# Patient Record
Sex: Male | Born: 1980 | Race: White | Hispanic: No | Marital: Single | Smoking: Former smoker
Health system: Southern US, Community
[De-identification: ages and names within clinical notes are randomized; demographics above are authoritative.]

## PROBLEM LIST (undated history)

## (undated) DIAGNOSIS — F411 Generalized anxiety disorder: Principal | ICD-10-CM

## (undated) DIAGNOSIS — I10 Essential (primary) hypertension: Secondary | ICD-10-CM

## (undated) DIAGNOSIS — F419 Anxiety disorder, unspecified: Secondary | ICD-10-CM

## (undated) DIAGNOSIS — E785 Hyperlipidemia, unspecified: Secondary | ICD-10-CM

## (undated) DIAGNOSIS — Z87442 Personal history of urinary calculi: Secondary | ICD-10-CM

## (undated) HISTORY — DX: Anxiety disorder, unspecified: F41.9

## (undated) HISTORY — PX: KIDNEY STONE SURGERY: SHX686

## (undated) HISTORY — DX: Generalized anxiety disorder: F41.1

## (undated) HISTORY — PX: EYE SURGERY: SHX253

## (undated) HISTORY — DX: Personal history of urinary calculi: Z87.442

## (undated) HISTORY — DX: Hyperlipidemia, unspecified: E78.5

## (undated) HISTORY — DX: Essential (primary) hypertension: I10

---

## 2009-11-09 ENCOUNTER — Emergency Department (HOSPITAL_BASED_OUTPATIENT_CLINIC_OR_DEPARTMENT_OTHER): Admission: EM | Admit: 2009-11-09 | Discharge: 2009-11-09 | Payer: Self-pay | Admitting: Emergency Medicine

## 2009-11-09 ENCOUNTER — Ambulatory Visit: Payer: Self-pay | Admitting: Diagnostic Radiology

## 2011-02-24 LAB — URINALYSIS, ROUTINE W REFLEX MICROSCOPIC
Bilirubin Urine: NEGATIVE
Ketones, ur: NEGATIVE mg/dL
Leukocytes, UA: NEGATIVE
Nitrite: NEGATIVE
Specific Gravity, Urine: 1.022 (ref 1.005–1.030)
Urobilinogen, UA: 0.2 mg/dL (ref 0.0–1.0)
pH: 6 (ref 5.0–8.0)

## 2011-02-24 LAB — BASIC METABOLIC PANEL
BUN: 15 mg/dL (ref 6–23)
CO2: 30 mEq/L (ref 19–32)
Calcium: 9.5 mg/dL (ref 8.4–10.5)
Chloride: 105 mEq/L (ref 96–112)
Creatinine, Ser: 0.9 mg/dL (ref 0.4–1.5)

## 2011-02-24 LAB — CBC
HCT: 45.3 % (ref 39.0–52.0)
MCHC: 35.2 g/dL (ref 30.0–36.0)
MCV: 90.5 fL (ref 78.0–100.0)
Platelets: 145 10*3/uL — ABNORMAL LOW (ref 150–400)
RDW: 11.9 % (ref 11.5–15.5)
WBC: 10.8 10*3/uL — ABNORMAL HIGH (ref 4.0–10.5)

## 2011-02-24 LAB — DIFFERENTIAL
Basophils Absolute: 0.1 10*3/uL (ref 0.0–0.1)
Basophils Relative: 1 % (ref 0–1)
Eosinophils Absolute: 0.1 10*3/uL (ref 0.0–0.7)
Eosinophils Relative: 1 % (ref 0–5)
Lymphocytes Relative: 26 % (ref 12–46)
Lymphs Abs: 2.8 10*3/uL (ref 0.7–4.0)
Monocytes Absolute: 0.9 10*3/uL (ref 0.1–1.0)
Monocytes Relative: 8 % (ref 3–12)
Neutro Abs: 6.9 10*3/uL (ref 1.7–7.7)
Neutrophils Relative %: 65 % (ref 43–77)

## 2011-02-24 LAB — URINE MICROSCOPIC-ADD ON

## 2016-06-03 ENCOUNTER — Emergency Department
Admission: EM | Admit: 2016-06-03 | Discharge: 2016-06-03 | Disposition: A | Discharge status: Home / Self Care | Payer: BLUE CROSS/BLUE SHIELD | Source: Home / Self Care | Attending: Family Medicine | Admitting: Family Medicine

## 2016-06-03 ENCOUNTER — Emergency Department (INDEPENDENT_AMBULATORY_CARE_PROVIDER_SITE_OTHER): Payer: BLUE CROSS/BLUE SHIELD

## 2016-06-03 ENCOUNTER — Encounter: Payer: Self-pay | Admitting: *Deleted

## 2016-06-03 DIAGNOSIS — M79644 Pain in right finger(s): Secondary | ICD-10-CM

## 2016-06-03 DIAGNOSIS — M79672 Pain in left foot: Secondary | ICD-10-CM

## 2016-06-03 DIAGNOSIS — M25541 Pain in joints of right hand: Secondary | ICD-10-CM | POA: Diagnosis not present

## 2016-06-03 DIAGNOSIS — M79641 Pain in right hand: Secondary | ICD-10-CM | POA: Diagnosis not present

## 2016-06-03 DIAGNOSIS — M7989 Other specified soft tissue disorders: Secondary | ICD-10-CM | POA: Diagnosis not present

## 2016-06-03 NOTE — ED Notes (Signed)
Pt c/o RT index finger swelling x 8 days. Denies injury or pain. He also c/o LT foot swelling, bruising and pain x 1 day.

## 2016-06-03 NOTE — ED Provider Notes (Signed)
CSN: [id number]     Arrival date & time 06/03/16  1049 History   First MD Initiated Contact with Patient 06/03/16 1100     Chief Complaint  Patient presents with  . Joint Swelling  . Foot Pain   (Consider location/radiation/quality/duration/timing/severity/associated sxs/prior Treatment) HPI  Anthony Huang is a 35 y.o. male presenting to UC with c/o Right index finger joint pain and Left foot pain.  Finger pain started about 8 days ago, foot pain started about 1 day ago with associated mild swelling and bruising. He notes he was at the beach last week and did a lot of jumping into and off of a boat, and helped raise and lower the anchor of the boat on the beach. He does not recall any specific injury but concerned he may have hit something to cause both area of pain and swelling. 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 He did take ibuprofen yesterday with mild relief. Pain in finger and foot is aching and sore, 5/10. Finger pain worse with full flexion. Foot pain worse with ambulation.    History reviewed. No pertinent past medical history. Past Surgical History  Procedure Laterality Date  . Kidney stone surgery     Family History  Problem Relation Age of Onset  . Hypertension Mother   . Hypertension Father    Social History  Substance Use Topics  . Smoking status: Never Smoker   . Smokeless tobacco: None  . Alcohol Use: Yes    Review of Systems  Constitutional: Negative for fever and chills.  Musculoskeletal: Positive for myalgias, joint swelling and arthralgias.  Skin: Positive for color change. Negative for wound.  Neurological: Negative for weakness and numbness.    Allergies  Review of patient's allergies indicates no known allergies.  Home Medications   Prior to Admission medications   Not on File   Meds Ordered and Administered this Visit  Medications - No data to  display  BP 147/99 mmHg  Pulse 82  Temp(Src) 98.1 F (36.7 C) (Oral)  Resp 16  Ht  (1.803 m)  Wt 208 lb (94.348 kg)  BMI 29.02 kg/m2  SpO2 98% No data found.   Physical Exam  Constitutional: He is oriented to person, place, and time. He appears well-developed and well-nourished.  HENT:  Head: Normocephalic and atraumatic.  Eyes: EOM are normal.  Neck: Normal range of motion.  Cardiovascular: Normal rate.   Pulses:      Radial pulses are 2+ on the right side.       Dorsalis pedis pulses are 2+ on the left side.  Right index finger: cap refill < 3 seconds  Pulmonary/Chest: Effort normal.  Musculoskeletal: Normal range of motion. He exhibits edema and tenderness.  Right index finger: 2mm nodule on dorsal aspect of PIP, tender.  Full ROM.  Left foot: tenderness to dorsal/lateral aspect. Mild edema. Full ROM ankle and toes.  Neurological: He is alert and oriented to person, place, and time.  Skin: Skin is warm and dry.  Right index finger: skin in tact, no ecchymosis or erythema. Small callus/nodule on dorsal aspect of PIP. Tender. Left foot: 3cm area of mild edema and ecchymosis with tenderness on dorsal lateral aspect of foot.  Psychiatric: He has a normal mood and affect. His behavior is normal.  Nursing note and vitals reviewed.   ED Course  Procedures (including critical care time)  Labs Review Labs Reviewed - No data to display  Imaging Review Dg Hand Complete Right  06/03/2016  CLINICAL DATA:  Pain and swelling for approximately 1 week. No history of recent trauma EXAM: RIGHT HAND - COMPLETE 3+ VIEW COMPARISON:  None. FINDINGS: Frontal, oblique, and lateral views were obtained. There is evidence of old injury with remodeling of the distal fifth metacarpal. There is no acute fracture or dislocation. Joint spaces appear normal. No erosive change. IMPRESSION: Evidence of old trauma with remodeling involving the distal fifth metacarpal. No acute fracture or  dislocation. No apparent arthropathy. Electronically Signed   By: Bretta BangWilliam  Woodruff III M.D.   On: 06/03/2016 11:45   Dg Foot Complete Left  06/03/2016  CLINICAL DATA:  Pain and swelling for 1 week. EXAM: LEFT FOOT - COMPLETE 3+ VIEW COMPARISON:  None. FINDINGS: Frontal, oblique, and lateral views were obtained. There is no fracture or dislocation. The joint spaces appear normal. No erosive change. IMPRESSION: No fracture or dislocation.  No apparent arthropathy. Electronically Signed   By: Bretta BangWilliam  Woodruff III M.D.   On: 06/03/2016 11:44      MDM   1. Left foot pain   2. Finger pain, right    Pt c/o Right index finger pain and Left foot pain w/o specific known injuries.  PMS in tact.  Plain films: negative for acute abnormalities including no fractures or dislocations  Encouraged rest, ice, elevate, may take acetaminophen or ibuprofen. F/u with Dr. Denyse Amassorey, Sports medicine in 1-2 weeks if not improving. Patient verbalized understanding and agreement with treatment plan.     Junius Finnerrin O'Malley, PA-C 06/03/16 1158

## 2016-06-03 NOTE — Discharge Instructions (Signed)
° °  You may take 400-600mg  Ibuprofen (Motrin or Advil) every 6-8 hours for pain  Alternate with Tylenol  You may take 500mg  Tylenol every 4-6 hours as needed for pain

## 2016-10-13 ENCOUNTER — Ambulatory Visit: Payer: BLUE CROSS/BLUE SHIELD | Admitting: Physician Assistant

## 2016-11-04 ENCOUNTER — Ambulatory Visit (INDEPENDENT_AMBULATORY_CARE_PROVIDER_SITE_OTHER): Payer: BLUE CROSS/BLUE SHIELD | Admitting: Physician Assistant

## 2016-11-04 ENCOUNTER — Encounter: Payer: Self-pay | Admitting: Physician Assistant

## 2016-11-04 VITALS — BP 144/92 | HR 98 | Ht 71.0 in | Wt 202.0 lb

## 2016-11-04 DIAGNOSIS — Z13220 Encounter for screening for lipoid disorders: Secondary | ICD-10-CM | POA: Diagnosis not present

## 2016-11-04 DIAGNOSIS — Z0000 Encounter for general adult medical examination without abnormal findings: Secondary | ICD-10-CM

## 2016-11-04 DIAGNOSIS — Z87442 Personal history of urinary calculi: Secondary | ICD-10-CM

## 2016-11-04 DIAGNOSIS — I10 Essential (primary) hypertension: Secondary | ICD-10-CM | POA: Insufficient documentation

## 2016-11-04 DIAGNOSIS — F411 Generalized anxiety disorder: Secondary | ICD-10-CM

## 2016-11-04 DIAGNOSIS — Z131 Encounter for screening for diabetes mellitus: Secondary | ICD-10-CM | POA: Diagnosis not present

## 2016-11-04 HISTORY — DX: Personal history of urinary calculi: Z87.442

## 2016-11-04 HISTORY — DX: Generalized anxiety disorder: F41.1

## 2016-11-04 NOTE — Progress Notes (Signed)
   Subjective:    Patient ID: Anthony MeckelMatthew Huang, male    DOB: 06/08/1981, 35 y.o.   MRN: [id number]  HPI Pt is a 35 yo male who presents to the clinic to establish care.   .. Active Ambulatory Problems    Diagnosis Date Noted  . History of kidney stones 11/04/2016   Resolved Ambulatory Problems    Diagnosis Date Noted  . No Resolved Ambulatory Problems   No Additional Past Medical History   .Marland Kitchen. Family History  Problem Relation Age of Onset  . Hypertension Mother   . Hypertension Father   . Cancer Paternal Aunt     lung  . Suicidality Paternal Uncle   . Diabetes Maternal Grandfather    .Marland Kitchen. Social History   Social History  . Marital status: Single    Spouse name: N/A  . Number of children: N/A  . Years of education: N/A   Occupational History  . Not on file.   Social History Main Topics  . Smoking status: Former Games developermoker  . Smokeless tobacco: Never Used  . Alcohol use Yes  . Drug use: No  . Sexual activity: Yes   Other Topics Concern  . Not on file   Social History Narrative  . No narrative on file   Pt does have anxiety but never had medication. Exercise and eating right helps. He would like labs to make sure "everything looks ok". Family hx of HTN and personal hx. Does not want medication. Not checking BP at home. Denies any CP, palpitations, headaches or dizziness. Denies any suicidal or homicidal thoughts.    Review of Systems  All other systems reviewed and are negative.      Objective:   Physical Exam  Constitutional: He is oriented to person, place, and time. He appears well-developed and well-nourished.  HENT:  Head: Normocephalic and atraumatic.  Right Ear: External ear normal.  Left Ear: External ear normal.  Nose: Nose normal.  Mouth/Throat: Oropharynx is clear and moist. No oropharyngeal exudate.  Eyes: Conjunctivae are normal.  Neck: Normal range of motion. Neck supple.  Cardiovascular: Normal rate, regular rhythm and normal heart sounds.    Pulmonary/Chest: Effort normal and breath sounds normal.  Abdominal: Soft. Bowel sounds are normal. He exhibits no distension and no mass. There is no tenderness. There is no rebound and no guarding.  Lymphadenopathy:    He has no cervical adenopathy.  Neurological: He is alert and oriented to person, place, and time.  Psychiatric: He has a normal mood and affect. His behavior is normal.          Assessment & Plan:  Marland Kitchen.Marland Kitchen.Anthony Huang was seen today for establish care.  Diagnoses and all orders for this visit:  Annual physical exam -     Lipid panel -     COMPLETE METABOLIC PANEL WITH GFR  History of kidney stones  Screening for diabetes mellitus -     COMPLETE METABOLIC PANEL WITH GFR  Screening for lipid disorders -     Lipid panel  Essential hypertension, benign  GAD (generalized anxiety disorder)      Discussed DASH diet and exercise. Recheck in 2 months. If not decreased need to consider medication.  HO given for CPE.  Discussed anxiety and medication and ways to decrease anxiety. Follow up as needed.

## 2016-11-04 NOTE — Patient Instructions (Addendum)
Hypertension Hypertension is another name for high blood pressure. High blood pressure forces your heart to work harder to pump blood. A blood pressure reading has two numbers, which includes a higher number over a lower number (example: 110/72). Follow these instructions at home:  Have your blood pressure rechecked by your doctor.  Only take medicine as told by your doctor. Follow the directions carefully. The medicine does not work as well if you skip doses. Skipping doses also puts you at risk for problems.  Do not smoke.  Monitor your blood pressure at home as told by your doctor. Contact a doctor if:  You think you are having a reaction to the medicine you are taking.  You have repeat headaches or feel dizzy.  You have puffiness (swelling) in your ankles.  You have trouble with your vision. Get help right away if:  You get a very bad headache and are confused.  You feel weak, numb, or faint.  You get chest or belly (abdominal) pain.  You throw up (vomit).  You cannot breathe very well. This information is not intended to replace advice given to you by your health care provider. Make sure you discuss any questions you have with your health care provider. Document Released: 04/28/2008 Document Revised: 04/17/2016 Document Reviewed: 09/02/2013 Elsevier Interactive Patient Education  2017 [employer] DASH Eating Plan DASH stands for "Dietary Approaches to Stop Hypertension." The DASH eating plan is a healthy eating plan that has been shown to reduce high blood pressure (hypertension). Additional health benefits may include reducing the risk of type 2 diabetes mellitus, heart disease, and stroke. The DASH eating plan may also help with weight loss. What do I need to know about the DASH eating plan? For the DASH eating plan, you will follow these general guidelines:  Choose foods with less than 150 milligrams of sodium per serving (as listed on the food label).  Use  salt-free seasonings or herbs instead of table salt or sea salt.  Check with your health care provider or pharmacist before using salt substitutes.  Eat lower-sodium products. These are often labeled as "low-sodium" or "no salt added."  Eat fresh foods. Avoid eating a lot of canned foods.  Eat more vegetables, fruits, and low-fat dairy products.  Choose whole grains. Look for the word "whole" as the first word in the ingredient list.  Choose fish and skinless chicken or turkey more often than red meat. Limit fish, poultry, and meat to 6 oz (170 g) each day.  Limit sweets, desserts, sugars, and sugary drinks.  Choose heart-healthy fats.  Eat more home-cooked food and less restaurant, buffet, and fast food.  Limit fried foods.  Do not fry foods. Cook foods using methods such as baking, boiling, grilling, and broiling instead.  When eating at a restaurant, ask that your food be prepared with less salt, or no salt if possible. What foods can I eat? Seek help from a dietitian for individual calorie needs. Grains  Whole grain or whole wheat bread. Brown rice. Whole grain or whole wheat pasta. Quinoa, bulgur, and whole grain cereals. Low-sodium cereals. Corn or whole wheat flour tortillas. Whole grain cornbread. Whole grain crackers. Low-sodium crackers. Vegetables  Fresh or frozen vegetables (raw, steamed, roasted, or grilled). Low-sodium or reduced-sodium tomato and vegetable juices. Low-sodium or reduced-sodium tomato sauce and paste. Low-sodium or reduced-sodium canned vegetables. Fruits  All fresh, canned (in natural juice), or frozen fruits. Meat and Other Protein Products  Ground beef (85% or leaner), grass-fed   beef, or beef trimmed of fat. Skinless chicken or [place]. Ground chicken or [place]. Pork trimmed of fat. All fish and seafood. Eggs. Dried beans, peas, or lentils. Unsalted nuts and seeds. Unsalted canned beans. Dairy  Low-fat dairy products, such as skim or 1% milk, 2% or  reduced-fat cheeses, low-fat ricotta or cottage cheese, or plain low-fat yogurt. Low-sodium or reduced-sodium cheeses. Fats and Oils  Tub margarines without trans fats. Light or reduced-fat mayonnaise and salad dressings (reduced sodium). Avocado. Safflower, olive, or canola oils. Natural peanut or almond butter. Other  Unsalted popcorn and pretzels. The items listed above may not be a complete list of recommended foods or beverages. Contact your dietitian for more options.  What foods are not recommended? Grains  White bread. White pasta. White rice. Refined cornbread. Bagels and croissants. Crackers that contain trans fat. Vegetables  Creamed or fried vegetables. Vegetables in a cheese sauce. Regular canned vegetables. Regular canned tomato sauce and paste. Regular tomato and vegetable juices. Fruits  Canned fruit in light or heavy syrup. Fruit juice. Meat and Other Protein Products  Fatty cuts of meat. Ribs, chicken wings, bacon, sausage, bologna, salami, chitterlings, fatback, hot dogs, bratwurst, and packaged luncheon meats. Salted nuts and seeds. Canned beans with salt. Dairy  Whole or 2% milk, cream, half-and-half, and cream cheese. Whole-fat or sweetened yogurt. Full-fat cheeses or blue cheese. Nondairy creamers and whipped toppings. Processed cheese, cheese spreads, or cheese curds. Condiments  Onion and garlic salt, seasoned salt, table salt, and sea salt. Canned and packaged gravies. Worcestershire sauce. Tartar sauce. Barbecue sauce. Teriyaki sauce. Soy sauce, including reduced sodium. Steak sauce. Fish sauce. Oyster sauce. Cocktail sauce. Horseradish. Ketchup and mustard. Meat flavorings and tenderizers. Bouillon cubes. Hot sauce. Tabasco sauce. Marinades. Taco seasonings. Relishes. Fats and Oils  Butter, stick margarine, lard, shortening, ghee, and bacon fat. Coconut, palm kernel, or palm oils. Regular salad dressings. Other  Pickles and olives. Salted popcorn and pretzels. The  items listed above may not be a complete list of foods and beverages to avoid. Contact your dietitian for more information.  Where can I find more information? National Heart, Lung, and Blood Institute: CablePromo.itwww.nhlbi.nih.gov/health/health-topics/topics/dash/ This information is not intended to replace advice given to you by your health care provider. Make sure you discuss any questions you have with your health care provider. Document Released: 10/30/2011 Document Revised: 04/17/2016 Document Reviewed: 09/14/2013 Elsevier Interactive Patient Education  2017 Else[employer] Inc. Generalized Anxiety Disorder Generalized anxiety disorder (GAD) is a mental disorder. It interferes with life functions, including relationships, work, and school. GAD is different from normal anxiety, which everyone experiences at some point in their lives in response to specific life events and activities. Normal anxiety actually helps us prepare for and get through these life events and activities. Normal anxiety goes away after the event or activity is over.  GAD causes anxiety that is not necessarily related to specific events or activities. It also causes excess anxiety in proportion to specific events or activities. The anxiety associated with GAD is also difficult to control. GAD can vary from mild to severe. People with severe GAD can have intense waves of anxiety with physical symptoms (panic attacks).  SYMPTOMS The anxiety and worry associated with GAD are difficult to control. This anxiety and worry are related to many life events and activities and also occur more days than not for 6 months or longer. People with GAD also have three or more of the following symptoms (one or more in children):  Restlessness.  Fatigue.  Difficulty concentrating.   Irritability.  Muscle tension.  Difficulty sleeping or unsatisfying sleep. DIAGNOSIS GAD is diagnosed through an assessment by your health care provider. Your health  care provider will ask you questions aboutyour mood,physical symptoms, and events in your life. Your health care provider may ask you about your medical history and use of alcohol or drugs, including prescription medicines. Your health care provider may also do a physical exam and blood tests. Certain medical conditions and the use of certain substances can cause symptoms similar to those associated with GAD. Your health care provider may refer you to a mental health specialist for further evaluation. TREATMENT The following therapies are usually used to treat GAD:   Medication. Antidepressant medication usually is prescribed for long-term daily control. Antianxiety medicines may be added in severe cases, especially when panic attacks occur.   Talk therapy (psychotherapy). Certain types of talk therapy can be helpful in treating GAD by providing support, education, and guidance. A form of talk therapy called cognitive behavioral therapy can teach you healthy ways to think about and react to daily life events and activities.  Stress managementtechniques. These include yoga, meditation, and exercise and can be very helpful when they are practiced regularly. A mental health specialist can help determine which treatment is best for you. Some people see improvement with one therapy. However, other people require a combination of therapies. This information is not intended to replace advice given to you by your health care provider. Make sure you discuss any questions you have with your health care provider. Document Released: 03/07/2013 Document Revised: 12/01/2014 Document Reviewed: 03/07/2013 Elsevier Interactive Patient Education  2017 [employer]

## 2016-11-05 LAB — COMPLETE METABOLIC PANEL WITH GFR
ALBUMIN: 4.8 g/dL (ref 3.6–5.1)
ALK PHOS: 46 U/L (ref 40–115)
ALT: 20 U/L (ref 9–46)
AST: 19 U/L (ref 10–40)
BILIRUBIN TOTAL: 0.5 mg/dL (ref 0.2–1.2)
BUN: 13 mg/dL (ref 7–25)
CALCIUM: 9.6 mg/dL (ref 8.6–10.3)
CO2: 29 mmol/L (ref 20–31)
CREATININE: 0.89 mg/dL (ref 0.60–1.35)
Chloride: 102 mmol/L (ref 98–110)
GFR, Est Non African American: >89 mL/min (ref >=60)
Glucose, Bld: 94 mg/dL (ref 65–99)
Potassium: 4.2 mmol/L (ref 3.5–5.3)
Sodium: 139 mmol/L (ref 135–146)
Total Protein: 7.3 g/dL (ref 6.1–8.1)

## 2016-11-05 LAB — LIPID PANEL
CHOLESTEROL: 191 mg/dL (ref <200)
HDL: 34 mg/dL — ABNORMAL LOW (ref >40)
LDL Cholesterol: 133 mg/dL — ABNORMAL HIGH (ref <100)
TRIGLYCERIDES: 122 mg/dL (ref <150)
Total CHOL/HDL Ratio: 5.6 Ratio — ABNORMAL HIGH (ref <5.0)
VLDL: 24 mg/dL (ref <30)

## 2017-04-30 ENCOUNTER — Encounter: Payer: Self-pay | Admitting: Physician Assistant

## 2017-04-30 ENCOUNTER — Ambulatory Visit (INDEPENDENT_AMBULATORY_CARE_PROVIDER_SITE_OTHER): Payer: BLUE CROSS/BLUE SHIELD | Admitting: Physician Assistant

## 2017-04-30 VITALS — BP 156/94 | HR 94 | Ht 71.0 in | Wt 187.0 lb

## 2017-04-30 DIAGNOSIS — F411 Generalized anxiety disorder: Secondary | ICD-10-CM

## 2017-04-30 DIAGNOSIS — I10 Essential (primary) hypertension: Secondary | ICD-10-CM | POA: Diagnosis not present

## 2017-04-30 DIAGNOSIS — F341 Dysthymic disorder: Secondary | ICD-10-CM | POA: Diagnosis not present

## 2017-04-30 DIAGNOSIS — R17 Unspecified jaundice: Secondary | ICD-10-CM

## 2017-04-30 DIAGNOSIS — E785 Hyperlipidemia, unspecified: Secondary | ICD-10-CM | POA: Diagnosis not present

## 2017-04-30 DIAGNOSIS — R002 Palpitations: Secondary | ICD-10-CM | POA: Diagnosis not present

## 2017-04-30 DIAGNOSIS — Z13220 Encounter for screening for lipoid disorders: Secondary | ICD-10-CM | POA: Diagnosis not present

## 2017-04-30 HISTORY — DX: Hyperlipidemia, unspecified: E78.5

## 2017-04-30 MED ORDER — SERTRALINE HCL 25 MG PO TABS: 25.0000 mg | ORAL_TABLET | Freq: Every day | ORAL | 2 refills | Status: DC

## 2017-04-30 MED ORDER — ALPRAZOLAM 1 MG PO TABS: 1.0000 mg | ORAL_TABLET | Freq: Two times a day (BID) | ORAL | 0 refills | Status: DC | PRN

## 2017-04-30 NOTE — Patient Instructions (Addendum)
- Plan to go downstairs for labs today to check thyroid, kidney/liver function, blood counts - Start Sertraline (Zoloft) 25 mg every morning. If you experience fatigue/sleepiness, switch to evening dosing - Taking Xanax 1 tablet as needed for breakthrough anxiety/panic attacks. Do not combine with alcohol or other sedating medications - Follow-up in 4 weeks with me or Lesly Rubenstein  If you experience any worsening symptoms or suicidal thoughts, call  [facility] Crisis Hotline 803-293-9035 or go to the nearest emergency room or call 911 National Suicide Hotline 1-800-SUICIDE   Living With Anxiety After being diagnosed with an anxiety disorder, you may be relieved to know why you have felt or behaved a certain way. It is natural to also feel overwhelmed about the treatment ahead and what it will mean for your life. With care and support, you can manage this condition and recover from it. How to cope with anxiety Dealing with stress Stress is your body's reaction to life changes and events, both good and bad. Stress can last just a few hours or it can be ongoing. Stress can play a major role in anxiety, so it is important to learn both how to cope with stress and how to think about it differently. Talk with your health care provider or a counselor to learn more about stress reduction. He or she may suggest some stress reduction techniques, such as:  Music therapy. This can include creating or listening to music that you enjoy and that inspires you.  Mindfulness-based meditation. This involves being aware of your normal breaths, rather than trying to control your breathing. It can be done while sitting or walking.  Centering prayer. This is a kind of meditation that involves focusing on a word, phrase, or sacred image that is meaningful to you and that brings you peace.  Deep breathing. To do this, expand your stomach and inhale slowly through your nose. Hold your breath for 3-5 seconds. Then exhale slowly,  allowing your stomach muscles to relax.  Self-talk. This is a skill where you identify thought patterns that lead to anxiety reactions and correct those thoughts.  Muscle relaxation. This involves tensing muscles then relaxing them.  Choose a stress reduction technique that fits your lifestyle and personality. Stress reduction techniques take time and practice. Set aside 5-15 minutes a day to do them. Therapists can offer training in these techniques. The training may be covered by some insurance plans. Other things you can do to manage stress include:  Keeping a stress diary. This can help you learn what triggers your stress and ways to control your response.  Thinking about how you respond to certain situations. You may not be able to control everything, but you can control your reaction.  Making time for activities that help you relax, and not feeling guilty about spending your time in this way.  Therapy combined with coping and stress-reduction skills provides the best chance for successful treatment. Medicines Medicines can help ease symptoms. Medicines for anxiety include:  Anti-anxiety drugs.  Antidepressants.  Beta-blockers.  Medicines may be used as the main treatment for anxiety disorder, along with therapy, or if other treatments are not working. Medicines should be prescribed by a health care provider. Relationships Relationships can play a big part in helping you recover. Try to spend more time connecting with trusted friends and family members. Consider going to couples counseling, taking family education classes, or going to family therapy. Therapy can help you and others better understand the condition. How to recognize changes  in your condition Everyone has a different response to treatment for anxiety. Recovery from anxiety happens when symptoms decrease and stop interfering with your daily activities at home or work. This may mean that you will start to:  Have better  concentration and focus.  Sleep better.  Be less irritable.  Have more energy.  Have improved memory.  It is important to recognize when your condition is getting worse. Contact your health care provider if your symptoms interfere with home or work and you do not feel like your condition is improving. Where to find help and support: You can get help and support from these sources:  Self-help groups.  Online and [employer] organizations.  A trusted spiritual leader.  Couples counseling.  Family education classes.  Family therapy.  Follow these instructions at home:  Eat a healthy diet that includes plenty of vegetables, fruits, whole grains, low-fat dairy products, and lean protein. Do not eat a lot of foods that are high in solid fats, added sugars, or salt.  Exercise. Most adults should do the following: ? Exercise for at least 150 minutes each week. The exercise should increase your heart rate and make you sweat (moderate-intensity exercise). ? Strengthening exercises at least twice a week.  Cut down on caffeine, tobacco, alcohol, and other potentially harmful substances.  Get the right amount and quality of sleep. Most adults need 7-9 hours of sleep each night.  Make choices that simplify your life.  Take over-the-counter and prescription medicines only as told by your health care provider.  Avoid caffeine, alcohol, and certain over-the-counter cold medicines. These may make you feel worse. Ask your pharmacist which medicines to avoid.  Keep all follow-up visits as told by your health care provider. This is important. Questions to ask your health care provider  Would I benefit from therapy?  How often should I follow up with a health care provider?  How long do I need to take medicine?  Are there any long-term side effects of my medicine?  Are there any alternatives to taking medicine? Contact a health care provider if:  You have a hard time staying focused  or finishing daily tasks.  You spend many hours a day feeling worried about everyday life.  You become exhausted by worry.  You start to have headaches, feel tense, or have nausea.  You urinate more than normal.  You have diarrhea. Get help right away if:  You have a racing heart and shortness of breath.  You have thoughts of hurting yourself or others. If you ever feel like you may hurt yourself or others, or have thoughts about taking your own life, get help right away. You can go to your nearest emergency department or call:  Your local emergency services (911 in the [place]  A suicide crisis helpline, such as the National Suicide Prevention Lifeline at 708-209-22011-563-144-1774. This is open 24-hours a day.  Summary  Taking steps to deal with stress can help calm you.  Medicines cannot cure anxiety disorders, but they can help ease symptoms.  Family, friends, and partners can play a big part in helping you recover from an anxiety disorder. This information is not intended to replace advice given to you by your health care provider. Make sure you discuss any questions you have with your health care provider. Document Released: 11/04/2016 Document Revised: 11/04/2016 Document Reviewed: 11/04/2016 Elsevier Interactive Patient Education  [employer]2018 Elsevier Inc.

## 2017-04-30 NOTE — Progress Notes (Signed)
HPI:                                                                Anthony MeckelMatthew Fallas is a 36 y.o. male who [facility] today for anxiety  Patient reports long-standing history of anxiety since childhood. He states he has been resistant to try medication because he felt he could overcome it on his own. He states symptoms are becoming unbearable and previous coping skills, including exercise and CBT, are no longer controlling symptoms. He reports his brother is taking Zoloft and states this works well for him.   Anxiety  Presents for initial visit. Onset was more than 5 years ago. The problem has been gradually worsening. Symptoms include excessive worry, insomnia, irritability, nervous/anxious behavior and restlessness. Symptoms occur constantly. The severity of symptoms is causing significant distress. The symptoms are aggravated by work stress (recent break-up with long-term girlfriend). The quality of sleep is poor. Nighttime awakenings: one to two.   Risk factors include family history (childhood trauma). His past medical history is significant for anxiety/panic attacks and depression. There is no history of suicide attempts. Past treatments include counseling (CBT). The treatment provided moderate relief.  Depression       The patient presents with depression.  Associated symptoms include insomnia and restlessness.  Past medical history includes anxiety and depression.     Pertinent negatives include no suicide attempts.    Past Medical History:  Diagnosis Date  . Anxiety   . GAD (generalized anxiety disorder) 11/04/2016  . History of kidney stones 11/04/2016  . Hypertension    Past Surgical History:  Procedure Laterality Date  . EYE SURGERY    . KIDNEY STONE SURGERY     Social History  Substance Use Topics  . Smoking status: Former Games developermoker  . Smokeless tobacco: Never Used  . Alcohol use Yes   family history includes  Alcohol abuse in his brother and father; Cancer in his paternal aunt; Diabetes in his maternal grandfather; Hypertension in his father and mother; Suicidality in his paternal uncle.  ROS: negative except as noted in the HPI  Medications: Current Outpatient Prescriptions  Medication Sig Dispense Refill  . ALPRAZolam (XANAX) 1 MG tablet Take 1 tablet (1 mg total) by mouth 2 (two) times daily as needed for anxiety. 20 tablet 0  . sertraline (ZOLOFT) 25 MG tablet Take 1 tablet (25 mg total) by mouth daily. 30 tablet 2   No current facility-administered medications for this visit.    No Known Allergies     Objective:  BP (!) 156/94   Pulse 94   Ht 5\' 11"  (1.803 m)   Wt 187 lb (84.8 kg)   BMI 26.08 kg/m  Gen:  not ill-appearing, no distress Pulm: Normal work of breathing, normal phonation  Neuro: alert and oriented x 3, EOM's intact MSK: moving all extremities, normal gait and station, no peripheral edema Psych: well-groomed, cooperative, good eye contact, mood "anxious," affect mood-congruent, normal speech and thought content    No results found for this or any previous visit (from the past 72 hour(s)). No results found.  Depression screen PHQ 2/9 04/30/2017  Decreased Interest 0  Down, Depressed, Hopeless 2  PHQ - 2 Score 2  Altered sleeping 3  Tired, decreased energy 2  Change in appetite 2  Feeling bad or failure about yourself  3  Trouble concentrating 2  Moving slowly or fidgety/restless 3  Suicidal thoughts 0  PHQ-9 Score 17   GAD 7 : Generalized Anxiety Score 04/30/2017  Nervous, Anxious, on Edge 3  Control/stop worrying 3  Worry too much - different things 3  Trouble relaxing 3  Restless 2  Easily annoyed or irritable 1  Afraid - awful might happen 1  Total GAD 7 Score 16    Assessment and Plan: 36 y.o. male with   1. GAD (generalized anxiety disorder) - GAD7 score 16, severe - checking labs to r/o thyroid dysfunction, electrolyte disturbance, anemia -  patient desires to trial sertraline, since this has worked for his brother - sertraline (ZOLOFT) 25 MG tablet; Take 1 tablet (25 mg total) by mouth daily.  Dispense: 30 tablet; Refill: 2 - ALPRAZolam (XANAX) 1 MG tablet; Take 1 tablet (1 mg total) by mouth 2 (two) times daily as needed for anxiety.  Dispense: 20 tablet; Refill: 0 - CBC - Comprehensive metabolic panel  2. Dysthymia - sertraline and labs as above  3. Heart palpitations - CBC - TSH  4. Essential hypertension, benign BP Readings from Last 3 Encounters:  04/30/17 (!) 156/94  11/04/16 (!) 144/92  06/03/16 147/99  - BP persistently elevated in clinic - patient has declined medication in the past - recommend patient log pressures at home and bring readings to follow-up visit in 4 weeks - if home readings >140/90, will start medication  Patient education and anticipatory guidance given Patient agrees with treatment plan Follow-up in 4 weeks or sooner as needed if symptoms worsen or fail to improve  Levonne Hubert PA-C

## 2017-05-01 ENCOUNTER — Telehealth: Payer: Self-pay

## 2017-05-01 DIAGNOSIS — R17 Unspecified jaundice: Secondary | ICD-10-CM | POA: Insufficient documentation

## 2017-05-01 LAB — COMPREHENSIVE METABOLIC PANEL
ALBUMIN: 4.9 g/dL (ref 3.6–5.1)
ALK PHOS: 51 U/L (ref 40–115)
ALT: 24 U/L (ref 9–46)
AST: 22 U/L (ref 10–40)
BILIRUBIN TOTAL: 1.6 mg/dL — AB (ref 0.2–1.2)
BUN: 12 mg/dL (ref 7–25)
CALCIUM: 10 mg/dL (ref 8.6–10.3)
CO2: 20 mmol/L (ref 20–31)
Chloride: 104 mmol/L (ref 98–110)
Creat: 0.88 mg/dL (ref 0.60–1.35)
GLUCOSE: 84 mg/dL (ref 65–99)
Potassium: 4.3 mmol/L (ref 3.5–5.3)
Sodium: 137 mmol/L (ref 135–146)
TOTAL PROTEIN: 7.7 g/dL (ref 6.1–8.1)

## 2017-05-01 LAB — CBC
HEMATOCRIT: 46.8 % (ref 38.5–50.0)
HEMOGLOBIN: 16 g/dL (ref 13.2–17.1)
MCH: 30.9 pg (ref 27.0–33.0)
MCHC: 34.2 g/dL (ref 32.0–36.0)
MCV: 90.5 fL (ref 80.0–100.0)
MPV: 11.7 fL (ref 7.5–12.5)
Platelets: 171 10*3/uL (ref 140–400)
RBC: 5.17 MIL/uL (ref 4.20–5.80)
RDW: 13.9 % (ref 11.0–15.0)
WBC: 7.4 10*3/uL (ref 3.8–10.8)

## 2017-05-01 LAB — TSH: TSH: 1.07 m[IU]/L (ref 0.40–4.50)

## 2017-05-01 NOTE — Telephone Encounter (Signed)
-----   Message from [facility] Cummings, [place] sent at 04/30/2017  9:32 PM EDT ----- Can you let Matt know his blood pressure was elevated again at this visit (156/94). I would like him to purchase an automated cuff and monitor his pressure at home and bring his log to his follow-up appointment in 4 weeks

## 2017-05-01 NOTE — Telephone Encounter (Signed)
Pt notified -EH/RMA  

## 2017-05-01 NOTE — Addendum Note (Signed)
Addended by: Gena FrayUMMINGS, CHARLEY E on: 05/01/2017 09:40 AM   Modules accepted: Orders

## 2017-05-05 LAB — BILIRUBIN, FRACTIONATED(TOT/DIR/INDIR)
BILIRUBIN DIRECT: 0.1 mg/dL (ref <=0.2)
BILIRUBIN INDIRECT: 0.8 mg/dL (ref 0.2–1.2)
Total Bilirubin: 0.9 mg/dL (ref 0.2–1.2)

## 2017-05-06 NOTE — Progress Notes (Signed)
Your labs look good - normal kidney function - normal blood counts, no evidence of anemia - no evidence of diabetes - no evidence of thyroid disease

## 2017-09-09 ENCOUNTER — Telehealth: Payer: Self-pay | Admitting: Physician Assistant

## 2017-09-09 NOTE — Telephone Encounter (Signed)
Received phone call from Pt's mother stating she fears her son is going to harm himself. She reports he recently had a break up with his girlfriend and that sent him on a downhill spiral. Mother advises her other son is on his way to pick up [facility], he had gotten a hotel room where he had intended to take his own life. Mother reports at this time the Pt is "no longer a threat" because he stated "I couldn't do it, I can't leave my family." She states he wants to come home and sleep then "tomorrow he will go to whatever appointment I made him."   Based on the fact the Pt has expressed thoughts of suicide and has a plan, the best course of action is to call 911. Pt's mother states she "won't do that" because he has "been here before and locking him up didn't help." I also offered the idea of taking [facility] for evaluation and treatment. Advised mother this needs to happen now. She informed he is "not home yet" from where his brother went to pick him up this morning.  Mother states the Pt expressed desire to get help. Advised [facility] needs to [facility]. If he refuses, then 911 needs to be called. If she cannot make that call herself, she is to contact clinic and we will call for her.

## 2017-09-09 NOTE — Telephone Encounter (Signed)
Agree with plan 

## 2017-09-11 ENCOUNTER — Encounter: Payer: Self-pay | Admitting: Physician Assistant

## 2017-09-11 ENCOUNTER — Ambulatory Visit (INDEPENDENT_AMBULATORY_CARE_PROVIDER_SITE_OTHER): Payer: BLUE CROSS/BLUE SHIELD | Admitting: Physician Assistant

## 2017-09-11 ENCOUNTER — Telehealth: Payer: Self-pay

## 2017-09-11 VITALS — BP 128/86 | HR 91 | Wt 193.0 lb

## 2017-09-11 DIAGNOSIS — J209 Acute bronchitis, unspecified: Secondary | ICD-10-CM | POA: Diagnosis not present

## 2017-09-11 DIAGNOSIS — F39 Unspecified mood [affective] disorder: Secondary | ICD-10-CM | POA: Diagnosis not present

## 2017-09-11 DIAGNOSIS — F1290 Cannabis use, unspecified, uncomplicated: Secondary | ICD-10-CM

## 2017-09-11 DIAGNOSIS — F411 Generalized anxiety disorder: Secondary | ICD-10-CM | POA: Diagnosis not present

## 2017-09-11 DIAGNOSIS — R0989 Other specified symptoms and signs involving the circulatory and respiratory systems: Secondary | ICD-10-CM

## 2017-09-11 MED ORDER — ALBUTEROL SULFATE HFA 108 (90 BASE) MCG/ACT IN AERS: 1.0000 | INHALATION_SPRAY | Freq: Four times a day (QID) | RESPIRATORY_TRACT | 0 refills | Status: DC | PRN

## 2017-09-11 MED ORDER — PREDNISONE 50 MG PO TABS: ORAL_TABLET | ORAL | 0 refills | Status: DC

## 2017-09-11 MED ORDER — AZITHROMYCIN 250 MG PO TABS: ORAL_TABLET | ORAL | 0 refills | Status: DC

## 2017-09-11 MED ORDER — ALPRAZOLAM 1 MG PO TABS: 1.0000 mg | ORAL_TABLET | Freq: Every day | ORAL | 0 refills | Status: DC | PRN

## 2017-09-11 NOTE — Telephone Encounter (Signed)
Pt is requesting a refill on his xanax.  Please advise.

## 2017-09-11 NOTE — Telephone Encounter (Signed)
Based on previous office visit and phone note, it sounds like anxiety/depression are not controlled. Patient needs an appt for follow-up

## 2017-09-11 NOTE — Patient Instructions (Signed)

## 2017-09-11 NOTE — Telephone Encounter (Signed)
Attempted to call - VM is full

## 2017-09-11 NOTE — Telephone Encounter (Signed)
Spoke with patient, he was going to make an appointment to discuss.

## 2017-09-11 NOTE — Progress Notes (Signed)
HPI:                                                                Anthony MeckelMatthew Huang is a 36 y.o. male who [facility] today for anxiety and depression as well as cough  Depression/Anxiety: he self-discontinued sertraline after 3 weeks. He felt it was making him "ornery." Taking Xanax prn for anxiety/panic attacks. Requesting refill. Patient reports [facility] [place] and plans to follow-up with them for his anxiety and depression going forward. However, he is nervous about not having medication in the meantime. Denies symptoms of mania/hypomania. Denies suicidal thinking. Denies auditory/visual hallucinations.   Patient also reports cough x 10 days. Non-productive. Voice is hoarse and throat is sore. Denies fevers, chills, nightsweats, malaise, myalgias, hemoptysis. He has been taking Mucinex without much relief. He reports he smokes marijuana daily; quit smoking cigarettes.   Past Medical History:  Diagnosis Date  . Anxiety   . Borderline hyperlipidemia 04/30/2017  . GAD (generalized anxiety disorder) 11/04/2016  . History of kidney stones 11/04/2016  . Hypertension    Past Surgical History:  Procedure Laterality Date  . EYE SURGERY    . KIDNEY STONE SURGERY     Social History  Substance Use Topics  . Smoking status: Former Games developermoker  . Smokeless tobacco: Never Used  . Alcohol use Yes   family history includes Alcohol abuse in his brother and father; Cancer in his paternal aunt; Diabetes in his maternal grandfather; Hypertension in his father and mother; Suicidality in his paternal uncle.  ROS: negative except as noted in the HPI  Medications: Current Outpatient Prescriptions  Medication Sig Dispense Refill  . ALPRAZolam (XANAX) 1 MG tablet Take 1 tablet (1 mg total) by mouth 2 (two) times daily as needed for anxiety. 20 tablet 0   No current facility-administered medications for  this visit.    No Known Allergies     Objective:  BP 128/86   Pulse 91   Wt 193 lb (87.5 kg)   BMI 26.92 kg/m  Gen:  alert, not ill-appearing, no distress, appropriate for age HEENT: head normocephalic without obvious abnormality, conjunctiva and cornea clear, wearing glasses, TM's clear bilaterally, oropharynx without erythema, no exudates ,uvula midline, nares patent, no  trachea midline Pulm: Normal work of breathing, normal phonation, expiratory crackles at the right lung base CV: Normal rate, regular rhythm, s1 and s2 distinct, no murmurs, clicks or rubs  Neuro: alert and oriented x 3, no tremor MSK: extremities atraumatic, normal gait and station Skin: intact, no rashes on exposed skin, no jaundice, no cyanosis Psych: appearance casual, cooperative, Anthony eye contact, euthymic Anthony, affect Anthony-congruent, speech is articulate, and thought processes clear and goal-directed  Depression screen [facility] 09/11/2017 04/30/2017  Decreased Interest 1 0  Down, Depressed, Hopeless 1 2  PHQ - 2 Score 2 2  Altered sleeping 1 3  Tired, decreased energy 0 2  Change in appetite 0 2  Feeling bad or failure about yourself  0 3  Trouble concentrating 0 2  Moving slowly or fidgety/restless 0 3  Suicidal thoughts 0 0  PHQ-9 Score 3 17    GAD 7 : Generalized Anxiety Score 09/11/2017 04/30/2017  Nervous, Anxious, on Edge 1 3  Control/stop worrying 1 3  Worry too much - different things 1 3  Trouble relaxing 1 3  Restless 0 2  Easily annoyed or irritable 0 1  Afraid - awful might happen 0 1  Total GAD 7 Score 4 16     No results found for this or any previous visit (from the past 72 hour(s)). No results found.    Assessment and Plan: 36 y.o. male with   1. GAD (generalized anxiety disorder) - checked NCCSRS, no red flags - follow-up with [facility] - ALPRAZolam (XANAX) 1 MG tablet; Take 1 tablet (1 mg total) by mouth daily as needed for anxiety.  Dispense: 20 tablet;  Refill: 0  2. Respiratory crackles at right lung base - azithromycin (ZITHROMAX Z-PAK) 250 MG tablet; Take 2 tablets (500 mg) on  Day 1,  followed by 1 tablet (250 mg) once daily on Days 2 through 5.  Dispense: 6 tablet; Refill: 0 - predniSONE (DELTASONE) 50 MG tablet; One tab PO daily for 5 days.  Dispense: 5 tablet; Refill: 0 - DG Chest 2 View; Future  3. Bronchitis, acute, with bronchospasm - albuterol (PROVENTIL HFA;VENTOLIN HFA) 108 (90 Base) MCG/ACT inhaler; Inhale 1-2 puffs into the lungs every 6 (six) hours as needed for wheezing.  Dispense: 1 Inhaler; Refill: 0  4. Marijuana use, continuous   5. Anthony disorder (HCC) - PHQ2 score 2, no acute safety issues - follow-up with [facility]   Patient education and anticipatory guidance given Patient agrees with treatment plan Follow-up as needed if symptoms worsen or fail to improve  Anthony Hubert PA-C

## 2017-09-14 DIAGNOSIS — F329 Major depressive disorder, single episode, unspecified: Secondary | ICD-10-CM | POA: Diagnosis not present

## 2017-09-16 DIAGNOSIS — F329 Major depressive disorder, single episode, unspecified: Secondary | ICD-10-CM | POA: Diagnosis not present

## 2017-09-24 DIAGNOSIS — F329 Major depressive disorder, single episode, unspecified: Secondary | ICD-10-CM | POA: Diagnosis not present

## 2017-10-07 DIAGNOSIS — F329 Major depressive disorder, single episode, unspecified: Secondary | ICD-10-CM | POA: Diagnosis not present

## 2017-10-21 DIAGNOSIS — F329 Major depressive disorder, single episode, unspecified: Secondary | ICD-10-CM | POA: Diagnosis not present

## 2017-10-31 IMAGING — DX DG FOOT COMPLETE 3+V*L*
3 series · 3 of 3 positions shown · non-contrast
Comparison: None.

CLINICAL DATA: Pain and swelling for 1 week.

EXAM:
LEFT FOOT - COMPLETE 3+ VIEW

[foot ap]
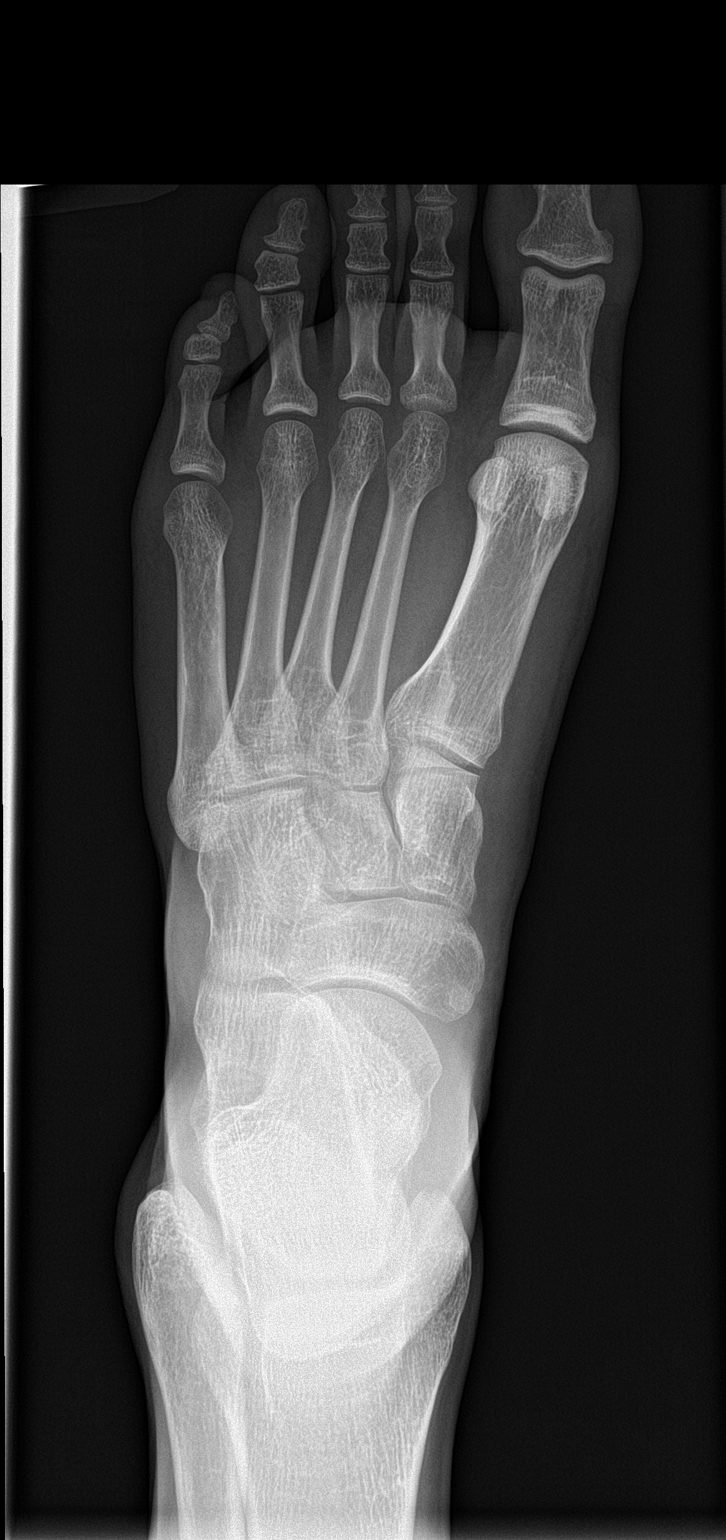

[foot obl]
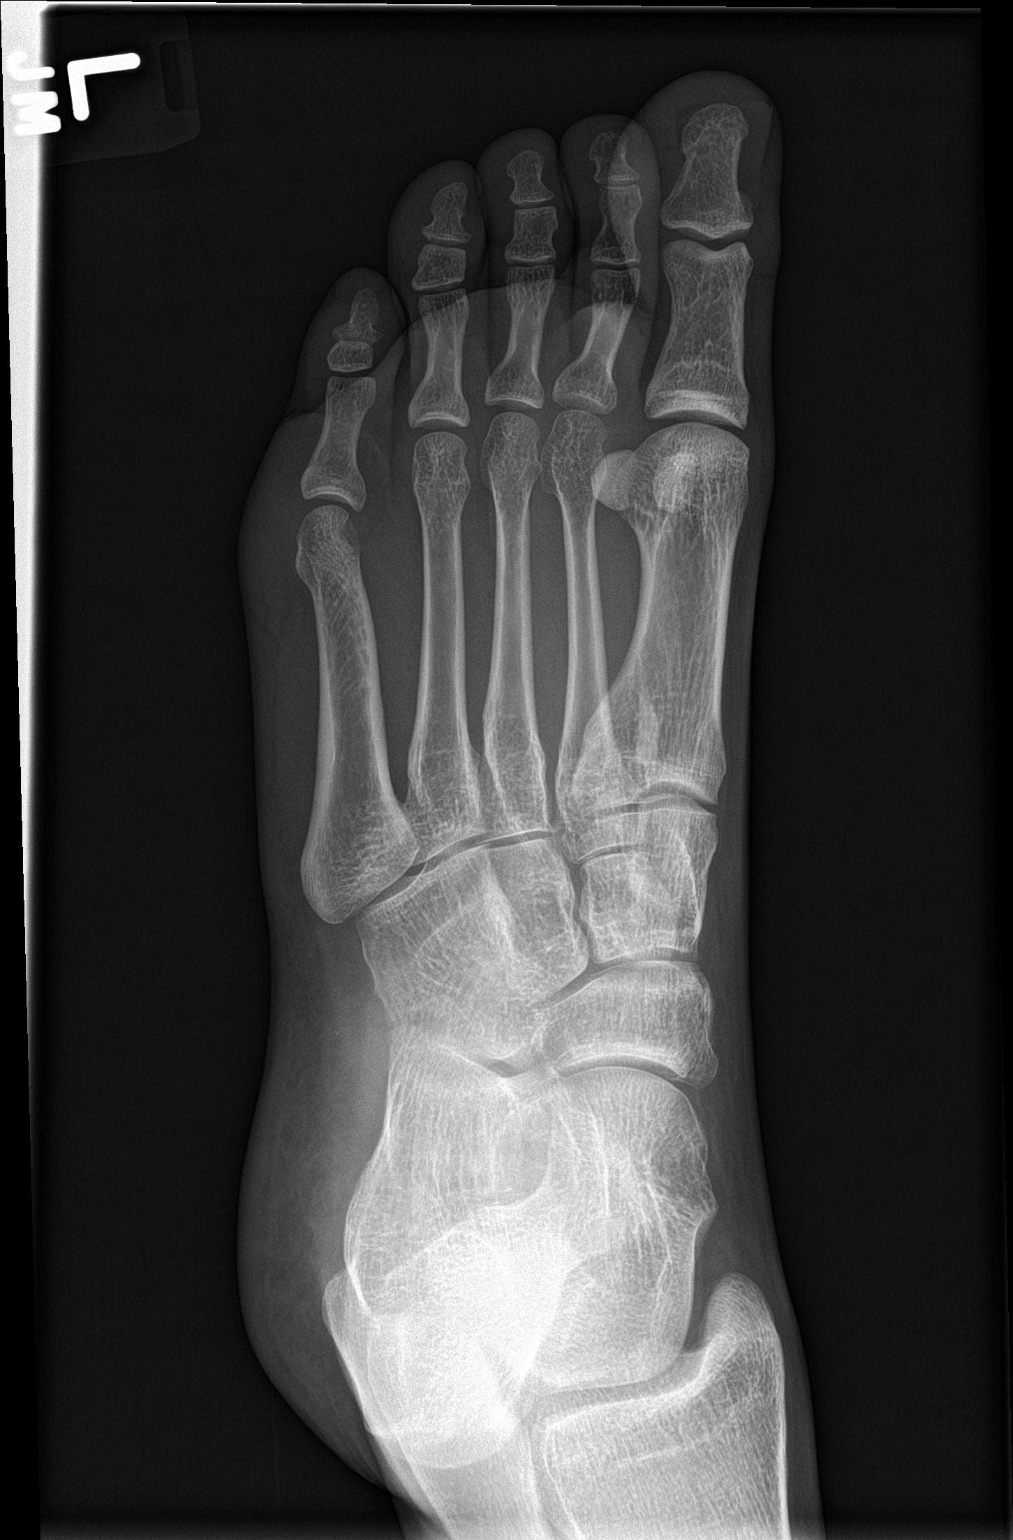

[foot lat]
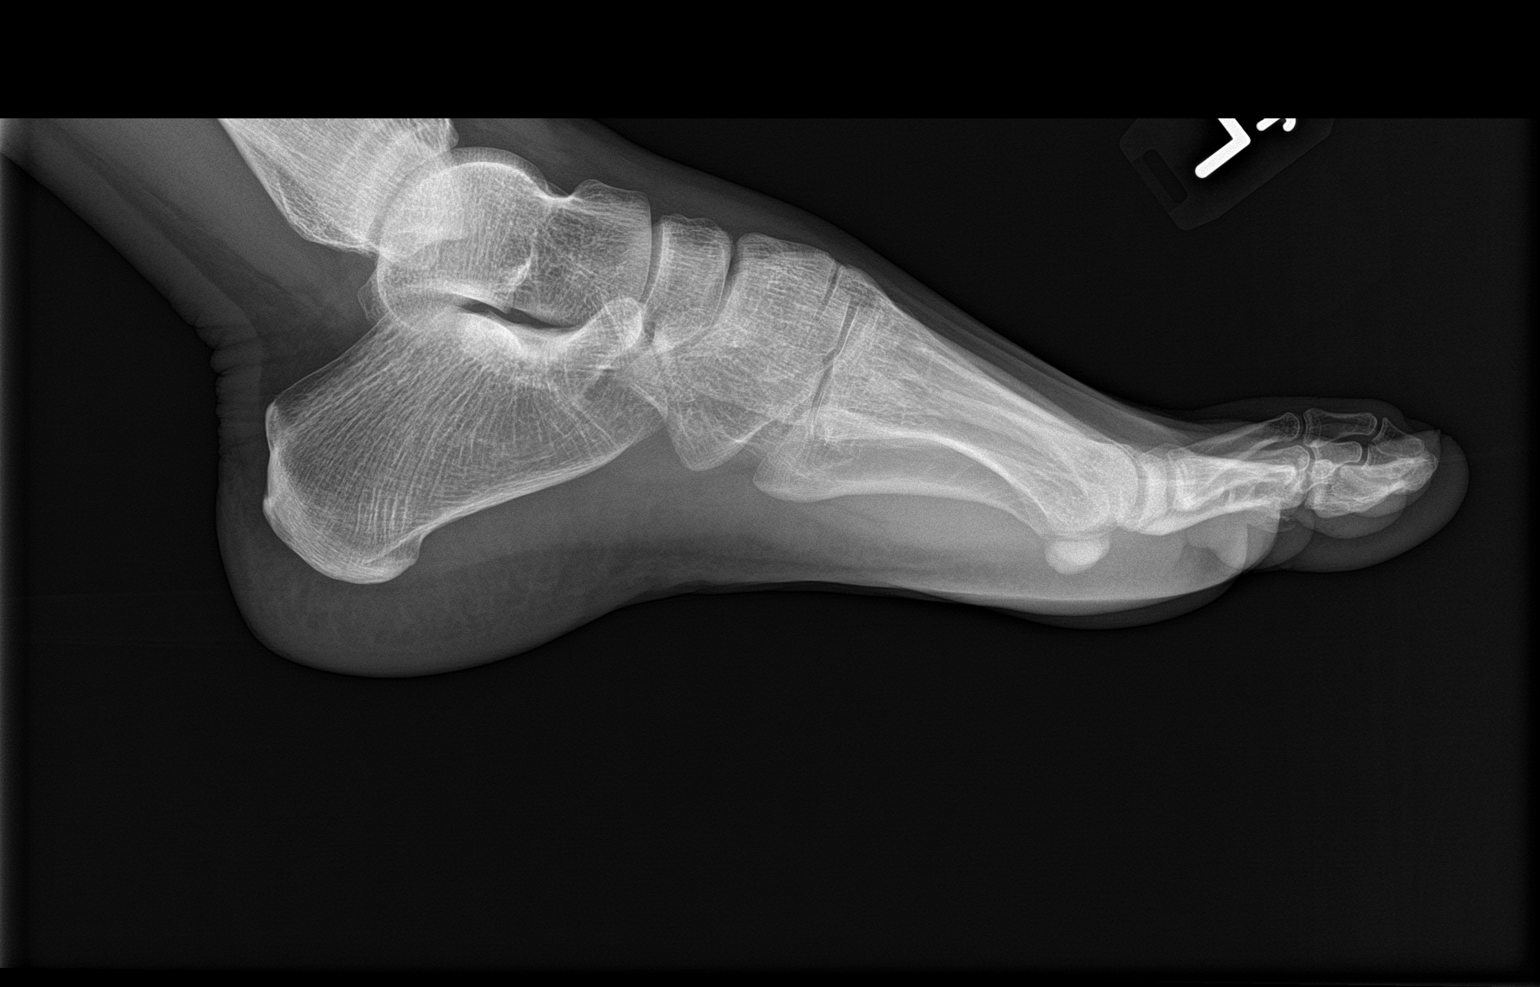

[3 of 3 positions shown; findings below may reference images not displayed]

FINDINGS: Frontal, oblique, and lateral views were obtained. There is no
fracture or dislocation. The joint spaces appear normal. No erosive
change.
IMPRESSION: No fracture or dislocation.  No apparent arthropathy.

## 2017-11-11 DIAGNOSIS — F329 Major depressive disorder, single episode, unspecified: Secondary | ICD-10-CM | POA: Diagnosis not present

## 2018-11-07 DIAGNOSIS — J069 Acute upper respiratory infection, unspecified: Secondary | ICD-10-CM | POA: Diagnosis not present

## 2021-05-15 ENCOUNTER — Other Ambulatory Visit: Payer: Self-pay

## 2021-05-15 ENCOUNTER — Encounter: Payer: Self-pay | Admitting: Physician Assistant

## 2021-05-15 ENCOUNTER — Ambulatory Visit (INDEPENDENT_AMBULATORY_CARE_PROVIDER_SITE_OTHER): Payer: No Typology Code available for payment source | Admitting: Physician Assistant

## 2021-05-15 VITALS — BP 155/99 | HR 92 | Ht 71.0 in | Wt 209.0 lb

## 2021-05-15 DIAGNOSIS — Z13220 Encounter for screening for lipoid disorders: Secondary | ICD-10-CM

## 2021-05-15 DIAGNOSIS — I10 Essential (primary) hypertension: Secondary | ICD-10-CM

## 2021-05-15 DIAGNOSIS — E663 Overweight: Secondary | ICD-10-CM | POA: Diagnosis not present

## 2021-05-15 DIAGNOSIS — K429 Umbilical hernia without obstruction or gangrene: Secondary | ICD-10-CM | POA: Diagnosis not present

## 2021-05-15 DIAGNOSIS — Z131 Encounter for screening for diabetes mellitus: Secondary | ICD-10-CM

## 2021-05-15 DIAGNOSIS — Z1159 Encounter for screening for other viral diseases: Secondary | ICD-10-CM

## 2021-05-15 MED ORDER — LOSARTAN POTASSIUM 25 MG PO TABS: 25.0000 mg | ORAL_TABLET | Freq: Every day | ORAL | 0 refills | Status: DC

## 2021-05-15 NOTE — Progress Notes (Signed)
Subjective:    Patient ID: Anthony Huang, male    DOB: 1981/05/16, 40 y.o.   MRN: [id number]  HPI Patient is a 40 year old male who presents to the clinic to discuss umbilical hernia which is worsening.  Patient had a umbilical hernia as well as 2 inguinal hernias at birth.  Due to inguinal hernias were repaired.  He has never had his umbilical hernia repaired.  It has not caused him any problems until the last few months.  He does do a lot of heavy lifting and noticing when he engages with lifting there is pain and discomfort.  He has started to avoid doing certain jobs at work.  He wants to get this fixed.  He has never had any trouble reducing it.  He denies any bowel changes.  He denies any fever, chills.  Overall he does wish to get healthier.  He has noticed that his blood pressure has been around 150-160/80 200 when he checks using his dad's cuff.  He denies any chest pain, palpitations, headache or vision changes.  His dad did just have a stroke and he does not want that to happen to him.  He admits he is not eating healthy or exercising.    Active Ambulatory Problems    Diagnosis Date Noted   History of kidney stones 11/04/2016   Essential hypertension, benign 11/04/2016   GAD (generalized anxiety disorder) 11/04/2016   Borderline hyperlipidemia 04/30/2017   Dysthymia 04/30/2017   Serum total bilirubin elevated 05/01/2017   Marijuana use, continuous 09/11/2017   Mood disorder (HCC) 09/11/2017   Umbilical hernia without obstruction and without gangrene 05/15/2021   Resolved Ambulatory Problems    Diagnosis Date Noted   No Resolved Ambulatory Problems   Past Medical History:  Diagnosis Date   Anxiety    Hypertension      Review of Systems See HPI.     Objective:   Physical Exam Vitals reviewed.  Constitutional:      Appearance: Normal appearance.  HENT:     Head: Normocephalic.  Neck:     Vascular: No carotid bruit.  Cardiovascular:     Rate and Rhythm: Normal  rate and regular rhythm.     Pulses: Normal pulses.     Heart sounds: Normal heart sounds.  Pulmonary:     Effort: Pulmonary effort is normal.     Breath sounds: Normal breath sounds.  Abdominal:     General: There is no distension.     Palpations: Abdomen is soft. There is no mass.     Tenderness: There is abdominal tenderness. There is no right CVA tenderness, left CVA tenderness, guarding or rebound.     Hernia: A hernia is present.     Comments: Small pea sized umbilical hernia at 12 oclock reducible but tender to palpation.   Neurological:     General: No focal deficit present.     Mental Status: He is alert and oriented to person, place, and time.  Psychiatric:        Mood and Affect: Mood normal.   .. Depression screen [facility] 2/9 05/15/2021 09/11/2017 04/30/2017  Decreased Interest 1 1 0  Down, Depressed, Hopeless 1 1 2   PHQ - 2 Score 2 2 2   Altered sleeping 0 1 3  Tired, decreased energy 1 0 2  Change in appetite 1 0 2  Feeling bad or failure about yourself  0 0 3  Trouble concentrating 0 0 2  Moving slowly or fidgety/restless 0 0  3  Suicidal thoughts 0 0 0  PHQ-9 Score 4 3 17   Difficult doing work/chores Not difficult at all - -   . GAD 7 : Generalized Anxiety Score 05/15/2021 09/11/2017 04/30/2017  Nervous, Anxious, on Edge 2 1 3   Control/stop worrying 1 1 3   Worry too much - different things 1 1 3   Trouble relaxing 3 1 3   Restless 1 0 2  Easily annoyed or irritable 1 0 1  Afraid - awful might happen 0 0 1  Total GAD 7 Score 9 4 16   Anxiety Difficulty Somewhat difficult - -            Assessment & Plan:  06/30/2017 Rickie was seen today for hernia.  Diagnoses and all orders for this visit:  Umbilical hernia without obstruction and without gangrene -     Ambulatory referral to General Surgery  Screening for diabetes mellitus -     Lipid Panel w/reflex Direct LDL -     COMPLETE METABOLIC PANEL WITH GFR -     TSH -     CBC with  Differential/Platelet  Overweight (BMI 25.0-29.9) -     COMPLETE METABOLIC PANEL WITH GFR -     TSH  Screening for lipid disorders -     Lipid Panel w/reflex Direct LDL  Encounter for hepatitis C screening test for low risk patient -     Hepatitis C Antibody  Essential hypertension, benign -     losartan (COZAAR) 25 MG tablet; Take 1 tablet (25 mg total) by mouth daily.  Referral made to general surgery for umbilical hernia. Discussed red flag and urgency signs if not able to be reduced.   .Discussed low carb diet with 1500 calories and 80g of protein.  Exercising at least 150 minutes a week.  My Fitness Pal could be a .  Weight loss could help with hernia and BP as well as overall health.   BP elevated. Family hx of stroke. Start cozaar daily.   Needs screening labs.   Follow up in 3 months.

## 2021-05-15 NOTE — Patient Instructions (Addendum)
Get labs today.  Will make referral today for general surgery.  Start losaartan.   Umbilical Hernia, Adult  A hernia is a bulge of tissue that pushes through an opening between muscles. An umbilical hernia happens in the abdomen, near the belly button (umbilicus). The hernia may contain tissues from the small intestine, large intestine, or fatty tissue covering the intestines (omentum). Umbilical hernias in adults tend to get worse over time, and they requiresurgical treatment. There are several types of umbilical hernias. You may have: A hernia located just above or below the umbilicus (indirect hernia). This is the most common type of umbilical hernia in adults. A hernia that forms through an opening formed by the umbilicus (direct hernia). A hernia that comes and goes (reducible hernia). A reducible hernia may be visible only when you strain, lift something heavy, or cough. This type of hernia can be pushed back into the abdomen (reduced). A hernia that traps abdominal tissue inside the hernia (incarcerated hernia). This type of hernia cannot be reduced. A hernia that cuts off blood flow to the tissues inside the hernia (strangulated hernia). The tissues can start to die if this happens. This type of hernia requires emergency treatment. What are the causes? An umbilical hernia happens when tissue inside the abdomen presses on a weakarea of the abdominal muscles. What increases the risk? You may have a greater risk of this condition if you: Are obese. Have had several pregnancies. Have a buildup of fluid inside your abdomen (ascites). Have had surgery that weakens the abdominal muscles. What are the signs or symptoms? The main symptom of this condition is a painless bulge at or near the belly button. A reducible hernia may be visible only when you strain, lift something heavy, or cough. Other symptoms may include: Dull pain. A feeling of pressure. Symptoms of a strangulated hernia may  include: Pain that gets increasingly worse. Nausea and vomiting. Pain when pressing on the hernia. Skin over the hernia becoming red or purple. Constipation. Blood in the stool. How is this diagnosed? This condition may be diagnosed based on: A physical exam. You may be asked to cough or strain while standing. These actions increase the pressure inside your abdomen and force the hernia through the opening in your muscles. Your health care provider may try to reduce the hernia by pressing on it. Your symptoms and medical history. How is this treated? Surgery is the only treatment for an umbilical hernia. Surgery for a strangulated hernia is done as soon as possible. If you have a small herniathat is not incarcerated, you may need to lose weight before having surgery. Follow these instructions at home: Lose weight, if told by your health care provider. Do not try to push the hernia back in. Watch your hernia for any changes in color or size. Tell your health care provider if any changes occur. You may need to avoid activities that increase pressure on your hernia. Do not lift anything that is heavier than 10 lb (4.5 kg) until your health care provider says that this is safe. Take over-the-counter and prescription medicines only as told by your health care provider. Keep all follow-up visits as told by your health care provider. This is important. Contact a health care provider if: Your hernia gets larger. Your hernia becomes painful. Get help right away if: You develop sudden, severe pain near the area of your hernia. You have pain as well as nausea or vomiting. You have pain and the skin over  your hernia changes color. You develop a fever. This information is not intended to replace advice given to you by your health care provider. Make sure you discuss any questions you have with your healthcare provider. Document Revised: 12/23/2017 Document Reviewed: 05/11/2017 Elsevier Patient  Education  2021 [employer].

## 2021-05-16 ENCOUNTER — Encounter: Payer: Self-pay | Admitting: Physician Assistant

## 2021-05-16 LAB — COMPLETE METABOLIC PANEL WITH GFR
AG Ratio: 1.8 (calc) (ref 1.0–2.5)
ALT: 28 U/L (ref 9–46)
AST: 18 U/L (ref 10–40)
Albumin: 5 g/dL (ref 3.6–5.1)
Alkaline phosphatase (APISO): 50 U/L (ref 36–130)
BUN: 17 mg/dL (ref 7–25)
CO2: 26 mmol/L (ref 20–32)
Calcium: 10.2 mg/dL (ref 8.6–10.3)
Chloride: 102 mmol/L (ref 98–110)
Creat: 0.93 mg/dL (ref 0.60–1.35)
GFR, Est African American: 120 mL/min/{1.73_m2} (ref >=60)
GFR, Est Non African American: 104 mL/min/{1.73_m2} (ref >=60)
Globulin: 2.8 g/dL (calc) (ref 1.9–3.7)
Glucose, Bld: 94 mg/dL (ref 65–99)
Potassium: 4.4 mmol/L (ref 3.5–5.3)
Sodium: 139 mmol/L (ref 135–146)
Total Bilirubin: 1.5 mg/dL — ABNORMAL HIGH (ref 0.2–1.2)
Total Protein: 7.8 g/dL (ref 6.1–8.1)

## 2021-05-16 LAB — CBC WITH DIFFERENTIAL/PLATELET
Absolute Monocytes: 512 cells/uL (ref 200–950)
Basophils Absolute: 61 cells/uL (ref 0–200)
Basophils Relative: 1 %
Eosinophils Absolute: 153 cells/uL (ref 15–500)
Eosinophils Relative: 2.5 %
HCT: 48.9 % (ref 38.5–50.0)
Hemoglobin: 17 g/dL (ref 13.2–17.1)
Lymphs Abs: 2452 cells/uL (ref 850–3900)
MCH: 31.3 pg (ref 27.0–33.0)
MCHC: 34.8 g/dL (ref 32.0–36.0)
MCV: 90.1 fL (ref 80.0–100.0)
MPV: 11.7 fL (ref 7.5–12.5)
Monocytes Relative: 8.4 %
Neutro Abs: 2922 cells/uL (ref 1500–7800)
Neutrophils Relative %: 47.9 %
Platelets: 192 10*3/uL (ref 140–400)
RBC: 5.43 10*6/uL (ref 4.20–5.80)
RDW: 12.6 % (ref 11.0–15.0)
Total Lymphocyte: 40.2 %
WBC: 6.1 10*3/uL (ref 3.8–10.8)

## 2021-05-16 LAB — LIPID PANEL W/REFLEX DIRECT LDL
Cholesterol: 234 mg/dL — ABNORMAL HIGH (ref <200)
HDL: 45 mg/dL (ref >=40)
LDL Cholesterol (Calc): 160 mg/dL (calc) — ABNORMAL HIGH
Non-HDL Cholesterol (Calc): 189 mg/dL (calc) — ABNORMAL HIGH (ref <130)
Total CHOL/HDL Ratio: 5.2 (calc) — ABNORMAL HIGH (ref <5.0)
Triglycerides: 156 mg/dL — ABNORMAL HIGH (ref <150)

## 2021-05-16 LAB — HEPATITIS C ANTIBODY
Hepatitis C Ab: NONREACTIVE
SIGNAL TO CUT-OFF: 0.02 (ref <1.00)

## 2021-05-16 LAB — TSH: TSH: 1.08 mIU/L (ref 0.40–4.50)

## 2021-05-16 NOTE — Progress Notes (Signed)
Anthony Huang,   Thyroid looks great.  Kidney, liver, glucose look wonderful.  HDL, good cholesterol, looks good.  LDL, bad cholesterol, elevated and need to work on decreasing fried fatty foods.

## 2021-08-16 ENCOUNTER — Ambulatory Visit (INDEPENDENT_AMBULATORY_CARE_PROVIDER_SITE_OTHER): Payer: No Typology Code available for payment source | Admitting: Physician Assistant

## 2021-08-16 ENCOUNTER — Other Ambulatory Visit: Payer: Self-pay | Admitting: Physician Assistant

## 2021-08-16 DIAGNOSIS — Z5329 Procedure and treatment not carried out because of patient's decision for other reasons: Secondary | ICD-10-CM

## 2021-08-16 DIAGNOSIS — I10 Essential (primary) hypertension: Secondary | ICD-10-CM

## 2021-08-16 NOTE — Progress Notes (Signed)
No show

## 2021-09-03 ENCOUNTER — Other Ambulatory Visit: Payer: Self-pay | Admitting: Physician Assistant

## 2021-09-03 DIAGNOSIS — I10 Essential (primary) hypertension: Secondary | ICD-10-CM

## 2021-10-01 ENCOUNTER — Other Ambulatory Visit: Payer: Self-pay | Admitting: Physician Assistant

## 2021-10-01 DIAGNOSIS — I10 Essential (primary) hypertension: Secondary | ICD-10-CM

## 2021-10-01 NOTE — Telephone Encounter (Signed)
LVM for patient to call back to get this f/u appt scheduled with Jade. AM

## 2021-10-01 NOTE — Telephone Encounter (Signed)
2 weeks of Losartan sent to pharmacy. Needs appt.

## 2021-10-08 ENCOUNTER — Encounter: Payer: Self-pay | Admitting: Physician Assistant

## 2021-10-08 ENCOUNTER — Other Ambulatory Visit: Payer: Self-pay

## 2021-10-08 ENCOUNTER — Ambulatory Visit (INDEPENDENT_AMBULATORY_CARE_PROVIDER_SITE_OTHER): Payer: No Typology Code available for payment source | Admitting: Physician Assistant

## 2021-10-08 VITALS — BP 150/72 | HR 86 | Temp 98.4°F | Ht 71.0 in | Wt 227.0 lb

## 2021-10-08 DIAGNOSIS — I10 Essential (primary) hypertension: Secondary | ICD-10-CM

## 2021-10-08 DIAGNOSIS — Z6831 Body mass index (BMI) 31.0-31.9, adult: Secondary | ICD-10-CM | POA: Diagnosis not present

## 2021-10-08 DIAGNOSIS — E6609 Other obesity due to excess calories: Secondary | ICD-10-CM

## 2021-10-08 MED ORDER — LOSARTAN POTASSIUM 25 MG PO TABS: 25.0000 mg | ORAL_TABLET | Freq: Every day | ORAL | 1 refills | Status: DC

## 2021-10-08 NOTE — Progress Notes (Signed)
   Subjective:    Patient ID: Anthony Huang, male    DOB: 1981-04-19, 40 y.o.   MRN: [id number]  HPI Pt is a 40 yo obese male with HTN who presents to the clinic for medication refills.   He is not checking his bP regularly. He does check some and always under 140/90. No CP, palpitations, headaches, vision changes. He has gotten dizzy, room spinning, a few times after starting cozaar. Not eating healthy and not exercising.   .. Active Ambulatory Problems    Diagnosis Date Noted   History of kidney stones 11/04/2016   Essential hypertension, benign 11/04/2016   GAD (generalized anxiety disorder) 11/04/2016   Hyperlipidemia LDL goal <130 04/30/2017   Dysthymia 04/30/2017   Serum total bilirubin elevated 05/01/2017   Marijuana use, continuous 09/11/2017   Mood disorder (HCC) 09/11/2017   Umbilical hernia without obstruction and without gangrene 05/15/2021   Overweight (BMI 25.0-29.9) 05/15/2021   Class 1 obesity due to excess calories without serious comorbidity with body mass index (BMI) of 31.0 to 31.9 in adult 10/08/2021   Resolved Ambulatory Problems    Diagnosis Date Noted   No Resolved Ambulatory Problems   Past Medical History:  Diagnosis Date   Anxiety    Borderline hyperlipidemia 04/30/2017   Hypertension       Review of Systems    See HPI.  Objective:   Physical Exam Vitals reviewed.  Constitutional:      Appearance: He is obese.  HENT:     Head: Normocephalic.  Neck:     Vascular: No carotid bruit.  Cardiovascular:     Rate and Rhythm: Normal rate and regular rhythm.     Pulses: Normal pulses.     Heart sounds: Normal heart sounds.  Pulmonary:     Effort: Pulmonary effort is normal.     Breath sounds: Normal breath sounds.  Musculoskeletal:     Right lower leg: No edema.     Left lower leg: No edema.  Neurological:     General: No focal deficit present.     Mental Status: He is alert and oriented to person, place, and time.  Psychiatric:        Mood  and Affect: Mood normal.          Assessment & Plan:  Marland KitchenMarland KitchenQuartez was seen today for hypertension.  Diagnoses and all orders for this visit:  Essential hypertension, benign -     losartan (COZAAR) 25 MG tablet; Take 1 tablet (25 mg total) by mouth daily. -     COMPLETE METABOLIC PANEL WITH GFR  Class 1 obesity due to excess calories without serious comorbidity with body mass index (BMI) of 31.0 to 31.9 in adult   Discussed BP goal of under 140/90. Not to goal. Pt does not want to increase medication. He will make lifestyle changes.  Refilled losartan.  Cmp ordered.  Follow up in 6 months.

## 2022-04-08 ENCOUNTER — Ambulatory Visit: Payer: No Typology Code available for payment source | Admitting: Physician Assistant

## 2022-04-23 ENCOUNTER — Encounter: Payer: Self-pay | Admitting: Physician Assistant

## 2022-04-23 ENCOUNTER — Ambulatory Visit (INDEPENDENT_AMBULATORY_CARE_PROVIDER_SITE_OTHER): Payer: No Typology Code available for payment source | Admitting: Physician Assistant

## 2022-04-23 VITALS — BP 139/87 | HR 88 | Ht 71.0 in | Wt 227.0 lb

## 2022-04-23 DIAGNOSIS — I10 Essential (primary) hypertension: Secondary | ICD-10-CM | POA: Diagnosis not present

## 2022-04-23 MED ORDER — LOSARTAN POTASSIUM 25 MG PO TABS: 25.0000 mg | ORAL_TABLET | Freq: Every day | ORAL | 3 refills | Status: DC

## 2022-04-23 NOTE — Patient Instructions (Signed)
Hypertension, Adult ?Hypertension is another name for high blood pressure. High blood pressure forces your heart to work harder to pump blood. This can cause problems over time. ?There are two numbers in a blood pressure reading. There is a top number (systolic) over a bottom number (diastolic). It is best to have a blood pressure that is below 120/80. ?What are the causes? ?The cause of this condition is not known. Some other conditions can lead to high blood pressure. ?What increases the risk? ?Some lifestyle factors can make you more likely to develop high blood pressure: ?Smoking. ?Not getting enough exercise or physical activity. ?Being overweight. ?Having too much fat, sugar, calories, or salt (sodium) in your diet. ?Drinking too much alcohol. ?Other risk factors include: ?Having any of these conditions: ?Heart disease. ?Diabetes. ?High cholesterol. ?Kidney disease. ?Obstructive sleep apnea. ?Having a family history of high blood pressure and high cholesterol. ?Age. The risk increases with age. ?Stress. ?What are the signs or symptoms? ?High blood pressure may not cause symptoms. Very high blood pressure (hypertensive crisis) may cause: ?Headache. ?Fast or uneven heartbeats (palpitations). ?Shortness of breath. ?Nosebleed. ?Vomiting or feeling like you may vomit (nauseous). ?Changes in how you see. ?Very bad chest pain. ?Feeling dizzy. ?Seizures. ?How is this treated? ?This condition is treated by making healthy lifestyle changes, such as: ?Eating healthy foods. ?Exercising more. ?Drinking less alcohol. ?Your doctor may prescribe medicine if lifestyle changes do not help enough and if: ?Your top number is above 130. ?Your bottom number is above 80. ?Your personal target blood pressure may vary. ?Follow these instructions at home: ?Eating and drinking ? ?If told, follow the DASH eating plan. To follow this plan: ?Fill one half of your plate at each meal with fruits and vegetables. ?Fill one fourth of your plate  at each meal with whole grains. Whole grains include whole-wheat pasta, brown rice, and whole-grain bread. ?Eat or drink low-fat dairy products, such as skim milk or low-fat yogurt. ?Fill one fourth of your plate at each meal with low-fat (lean) proteins. Low-fat proteins include fish, chicken without skin, eggs, beans, and tofu. ?Avoid fatty meat, cured and processed meat, or chicken with skin. ?Avoid pre-made or processed food. ?Limit the amount of salt in your diet to less than 1,500 mg each day. ?Do not drink alcohol if: ?Your doctor tells you not to drink. ?You are pregnant, may be pregnant, or are planning to become pregnant. ?If you drink alcohol: ?Limit how much you have to: ?0-1 drink a day for women. ?0-2 drinks a day for men. ?Know how much alcohol is in your drink. In the [place] one drink equals one 12 oz bottle of beer (355 mL), one 5 oz glass of wine (148 mL), or one 1? oz glass of hard liquor (44 mL). ?Lifestyle ? ?Work with your doctor to stay at a healthy weight or to lose weight. Ask your doctor what the best weight is for you. ?Get at least 30 minutes of exercise that causes your heart to beat faster (aerobic exercise) most days of the week. This may include walking, swimming, or biking. ?Get at least 30 minutes of exercise that strengthens your muscles (resistance exercise) at least 3 days a week. This may include lifting weights or doing Pilates. ?Do not smoke or use any products that contain nicotine or tobacco. If you need help quitting, ask your doctor. ?Check your blood pressure at home as told by your doctor. ?Keep all follow-up visits. ?Medicines ?Take over-the-counter and prescription medicines   only as told by your doctor. Follow directions carefully. ?Do not skip doses of blood pressure medicine. The medicine does not work as well if you skip doses. Skipping doses also puts you at risk for problems. ?Ask your doctor about side effects or reactions to medicines that you should watch  for. ?Contact a doctor if: ?You think you are having a reaction to the medicine you are taking. ?You have headaches that keep coming back. ?You feel dizzy. ?You have swelling in your ankles. ?You have trouble with your vision. ?Get help right away if: ?You get a very bad headache. ?You start to feel mixed up (confused). ?You feel weak or numb. ?You feel faint. ?You have very bad pain in your: ?Chest. ?Belly (abdomen). ?You vomit more than once. ?You have trouble breathing. ?These symptoms may be an emergency. Get help right away. Call 911. ?Do not wait to see if the symptoms will go away. ?Do not drive yourself to the hospital. ?Summary ?Hypertension is another name for high blood pressure. ?High blood pressure forces your heart to work harder to pump blood. ?For most people, a normal blood pressure is less than 120/80. ?Making healthy choices can help lower blood pressure. If your blood pressure does not get lower with healthy choices, you may need to take medicine. ?This information is not intended to replace advice given to you by your health care provider. Make sure you discuss any questions you have with your health care provider. ?Document Revised: 08/29/2021 Document Reviewed: 08/29/2021 ?Elsevier Patient Education ? 2023 Elsevier Inc. ? ?

## 2022-04-23 NOTE — Progress Notes (Signed)
   Established Patient Office Visit  Subjective   Patient ID: Anthony Huang, male    DOB: [birth year]  Age: 41 y.o. MRN: [id number]  Chief Complaint  Patient presents with   Hypertension    HPI Pt is a 41 yo male with HTN who presents to the clinic to follow up on BP.   Pt is doing well on losartan. No concerns. He does feel better. He is checking BP at home and running 120s over low 80s. He reports always having high BP readings in office. No CP, palpitations, headaches, or vision changes. He is not drinking water like he should or eating right or exercising.   .. Active Ambulatory Problems    Diagnosis Date Noted   History of kidney stones 11/04/2016   White coat syndrome with diagnosis of hypertension 11/04/2016   GAD (generalized anxiety disorder) 11/04/2016   Hyperlipidemia LDL goal <130 04/30/2017   Dysthymia 04/30/2017   Serum total bilirubin elevated 05/01/2017   Marijuana use, continuous 09/11/2017   Mood disorder ([place]) [id number]   Umbilical hernia without obstruction and without gangrene 05/15/2021   Overweight (BMI 25.0-29.9) 05/15/2021   Class 1 obesity due to excess calories without serious comorbidity with body mass index (BMI) of 31.0 to 31.9 in adult 10/08/2021   Resolved Ambulatory Problems    Diagnosis Date Noted   No Resolved Ambulatory Problems   Past Medical History:  Diagnosis Date   Anxiety    Borderline hyperlipidemia 04/30/2017   Hypertension      Review of Systems  All other systems reviewed and are negative.    Objective:     BP 139/87   Pulse 88   Ht 5\' 11"  (1.803 m)   Wt 227 lb (103 kg)   SpO2 100%   BMI 31.66 kg/m  BP Readings from Last 3 Encounters:  04/23/22 139/87  10/08/21 (!) 150/72  05/15/21 (!) 155/99   Wt Readings from Last 3 Encounters:  04/23/22 227 lb (103 kg)  10/08/21 227 lb (103 kg)  05/15/21 209 lb (94.8 kg)      Physical Exam Vitals reviewed.  Constitutional:      Appearance: Normal appearance. He is  obese.  HENT:     Head: Normocephalic.  Neck:     Vascular: No carotid bruit.  Cardiovascular:     Rate and Rhythm: Normal rate and regular rhythm.     Pulses: Normal pulses.     Heart sounds: Normal heart sounds.  Pulmonary:     Effort: Pulmonary effort is normal.     Breath sounds: Normal breath sounds.  Neurological:     General: No focal deficit present.     Mental Status: He is alert and oriented to person, place, and time.  Psychiatric:        Mood and Affect: Mood normal.       Assessment & Plan:  Marland KitchenMarland KitchenMakei was seen today for hypertension.  Diagnoses and all orders for this visit:  White coat syndrome with diagnosis of hypertension  Essential hypertension, benign -     losartan (COZAAR) 25 MG tablet; Take 1 tablet (25 mg total) by mouth daily.   BP borderline and much better than last reading but patient reports home BP in the 120s over low 80s Refilled cozaar Cmp ordered today for medication management Discussed need for low salt diet and weight loss Follow up in 1 year   Iran Planas, PA-C

## 2022-04-24 LAB — COMPLETE METABOLIC PANEL WITH GFR
AG Ratio: 2 (calc) (ref 1.0–2.5)
ALT: 33 U/L (ref 9–46)
AST: 19 U/L (ref 10–40)
Albumin: 5 g/dL (ref 3.6–5.1)
Alkaline phosphatase (APISO): 50 U/L (ref 36–130)
BUN: 15 mg/dL (ref 7–25)
CO2: 27 mmol/L (ref 20–32)
Calcium: 9.9 mg/dL (ref 8.6–10.3)
Chloride: 103 mmol/L (ref 98–110)
Creat: 0.89 mg/dL (ref 0.60–1.26)
Globulin: 2.5 g/dL (calc) (ref 1.9–3.7)
Glucose, Bld: 104 mg/dL — ABNORMAL HIGH (ref 65–99)
Potassium: 4.7 mmol/L (ref 3.5–5.3)
Sodium: 138 mmol/L (ref 135–146)
Total Bilirubin: 0.7 mg/dL (ref 0.2–1.2)
Total Protein: 7.5 g/dL (ref 6.1–8.1)
eGFR: 112 mL/min/{1.73_m2} (ref >=60)

## 2022-04-24 NOTE — Progress Notes (Signed)
Perfect kidney and liver function.

## 2022-07-21 ENCOUNTER — Other Ambulatory Visit: Payer: Self-pay | Admitting: Physician Assistant

## 2022-07-21 DIAGNOSIS — I10 Essential (primary) hypertension: Secondary | ICD-10-CM

## 2022-08-15 ENCOUNTER — Telehealth: Payer: Self-pay

## 2022-08-18 ENCOUNTER — Other Ambulatory Visit: Payer: Self-pay | Admitting: Physician Assistant

## 2022-08-25 NOTE — Telephone Encounter (Signed)
error 

## 2023-06-09 ENCOUNTER — Other Ambulatory Visit: Payer: Self-pay | Admitting: Physician Assistant

## 2023-06-09 DIAGNOSIS — I10 Essential (primary) hypertension: Secondary | ICD-10-CM

## 2023-07-09 ENCOUNTER — Other Ambulatory Visit: Payer: Self-pay | Admitting: Physician Assistant

## 2023-07-09 DIAGNOSIS — I10 Essential (primary) hypertension: Secondary | ICD-10-CM

## 2023-07-14 ENCOUNTER — Ambulatory Visit: Payer: Self-pay | Admitting: Physician Assistant

## 2023-07-14 ENCOUNTER — Encounter: Payer: Self-pay | Admitting: Physician Assistant

## 2023-07-14 VITALS — BP 150/90 | HR 78 | Ht 71.0 in | Wt 223.0 lb

## 2023-07-14 DIAGNOSIS — Z6831 Body mass index (BMI) 31.0-31.9, adult: Secondary | ICD-10-CM

## 2023-07-14 DIAGNOSIS — Z13220 Encounter for screening for lipoid disorders: Secondary | ICD-10-CM

## 2023-07-14 DIAGNOSIS — I10 Essential (primary) hypertension: Secondary | ICD-10-CM

## 2023-07-14 DIAGNOSIS — E6609 Other obesity due to excess calories: Secondary | ICD-10-CM

## 2023-07-14 MED ORDER — LOSARTAN POTASSIUM 50 MG PO TABS: 50.0000 mg | ORAL_TABLET | Freq: Every day | ORAL | 0 refills | Status: DC

## 2023-07-14 NOTE — Progress Notes (Signed)
Established Patient Office Visit  Subjective   Patient ID: Anthony Huang, male    DOB: [birth year]  Age: 42 y.o. MRN: [id number]  Chief Complaint  Patient presents with   Medical Management of Chronic Issues    Med refill    HPI Patient is a 42 year old male with hypertension who presents to the clinic for follow-up.  He is self-pay so does not come to the clinic very often.  He denies any chest pain, palpitations, headaches, dizziness, shortness of breath.  He has completely stopped smoking weed since February 2024.  He has lost 10 pounds on his scales at home.  He is barely drinking alcohol except in some social situations.  He is feeling a lot better but he is still getting some high blood pressure readings at home.  He does admit to having some whitecoat hypertension and very anxious about coming to the doctor.  He is compliant with his Cozaar 25 mg daily.  He has no side effects to medication.    .. Active Ambulatory Problems    Diagnosis Date Noted   History of kidney stones 11/04/2016   White coat syndrome with diagnosis of hypertension 11/04/2016   GAD (generalized anxiety disorder) 11/04/2016   Hyperlipidemia LDL goal <130 04/30/2017   Dysthymia 04/30/2017   Serum total bilirubin elevated 05/01/2017   Marijuana use, continuous 09/11/2017   Mood disorder (HCC) 09/11/2017   Umbilical hernia without obstruction and without gangrene 05/15/2021   Overweight (BMI 25.0-29.9) 05/15/2021   Class 1 obesity due to excess calories without serious comorbidity with body mass index (BMI) of 31.0 to 31.9 in adult 10/08/2021   Essential hypertension, benign 07/14/2023   Resolved Ambulatory Problems    Diagnosis Date Noted   No Resolved Ambulatory Problems   Past Medical History:  Diagnosis Date   Anxiety    Borderline hyperlipidemia 04/30/2017   Hypertension      Review of Systems  All other systems reviewed and are negative.     Objective:     BP (!) 150/90   Pulse 78    Ht 5\' 11"  (1.803 m)   Wt 223 lb (101.2 kg)   SpO2 99%   BMI 31.10 kg/m  BP Readings from Last 3 Encounters:  07/14/23 (!) 150/90  04/23/22 139/87  10/08/21 (!) 150/72   Wt Readings from Last 3 Encounters:  07/14/23 223 lb (101.2 kg)  04/23/22 227 lb (103 kg)  10/08/21 227 lb (103 kg)      Physical Exam Constitutional:      Appearance: Normal appearance.  HENT:     Head: Normocephalic.  Cardiovascular:     Rate and Rhythm: Normal rate and regular rhythm.     Pulses: Normal pulses.     Heart sounds: Normal heart sounds.  Pulmonary:     Effort: Pulmonary effort is normal.     Breath sounds: Normal breath sounds.  Neurological:     General: No focal deficit present.     Mental Status: He is alert and oriented to person, place, and time.  Psychiatric:        Mood and Affect: Mood normal.       The 10-year ASCVD risk score (Arnett DK, et al., 2019) is: 2.8%    Assessment & Plan:  Marland KitchenMarland KitchenJequan was seen today for medical management of chronic issues.  Diagnoses and all orders for this visit:  Essential hypertension, benign -     CMP14+EGFR -     Lipid panel -  losartan (COZAAR) 50 MG tablet; Take 1 tablet (50 mg total) by mouth daily.  Class 1 obesity due to excess calories without serious comorbidity with body mass index (BMI) of 31.0 to 31.9 in adult -     CMP14+EGFR -     Lipid panel  White coat syndrome with diagnosis of hypertension -     CMP14+EGFR -     Lipid panel  Screening for lipid disorders -     Lipid panel   BP still elevated on 2nd recheck Increased cozaar to 50mg  Pt is self pay and does not want to pay for nurse visit for recheck Discussed mychart reporting BP to get to goal of under 140/90.  Discussed diet and ways to manage HTN. He does need lipid panel and CMP for screening purposes. Explained to patient the importance of this.     Return in about 4 weeks (around 08/11/2023), or if symptoms worsen or fail to improve.    Tandy Gaw, PA-C

## 2023-07-14 NOTE — Patient Instructions (Signed)
Managing Your Hypertension Hypertension, also called high blood pressure, is when the force of the blood pressing against the walls of the arteries is too strong. Arteries are blood vessels that carry blood from your heart throughout your body. Hypertension forces the heart to work harder to pump blood and may cause the arteries to become narrow or stiff. Understanding blood pressure readings A blood pressure reading includes a higher number over a lower number: The first, or top, number is called the systolic pressure. It is a measure of the pressure in your arteries as your heart beats. The second, or bottom number, is called the diastolic pressure. It is a measure of the pressure in your arteries as the heart relaxes. For most people, a normal blood pressure is below 120/80. Your personal target blood pressure may vary depending on your medical conditions, your age, and other factors. Blood pressure is classified into four stages. Based on your blood pressure reading, your health care provider may use the following stages to determine what type of treatment you need, if any. Systolic pressure and diastolic pressure are measured in a unit called millimeters of mercury (mmHg). Normal Systolic pressure: below 120. Diastolic pressure: below 80. Elevated Systolic pressure: 120-129. Diastolic pressure: below 80. Hypertension stage 1 Systolic pressure: 130-139. Diastolic pressure: 80-89. Hypertension stage 2 Systolic pressure: 140 or above. Diastolic pressure: 90 or above. How can this condition affect me? Managing your hypertension is very important. Over time, hypertension can damage the arteries and decrease blood flow to parts of the body, including the brain, heart, and kidneys. Having untreated or uncontrolled hypertension can lead to: A heart attack. A stroke. A weakened blood vessel (aneurysm). Heart failure. Kidney damage. Eye damage. Memory and concentration problems. Vascular  dementia. What actions can I take to manage this condition? Hypertension can be managed by making lifestyle changes and possibly by taking medicines. Your health care provider will help you make a plan to bring your blood pressure within a normal range. You may be referred for counseling on a healthy diet and physical activity. Nutrition  Eat a diet that is high in fiber and potassium, and low in salt (sodium), added sugar, and fat. An example eating plan is called the DASH diet. DASH stands for Dietary Approaches to Stop Hypertension. To eat this way: Eat plenty of fresh fruits and vegetables. Try to fill one-half of your plate at each meal with fruits and vegetables. Eat whole grains, such as whole-wheat pasta, brown rice, or whole-grain bread. Fill about one-fourth of your plate with whole grains. Eat low-fat dairy products. Avoid fatty cuts of meat, processed or cured meats, and poultry with skin. Fill about one-fourth of your plate with lean proteins such as fish, chicken without skin, beans, eggs, and tofu. Avoid pre-made and processed foods. These tend to be higher in sodium, added sugar, and fat. Reduce your daily sodium intake. Many people with hypertension should eat less than 1,500 mg of sodium a day. Lifestyle  Work with your health care provider to maintain a healthy body weight or to lose weight. Ask what an ideal weight is for you. Get at least 30 minutes of exercise that causes your heart to beat faster (aerobic exercise) most days of the week. Activities may include walking, swimming, or biking. Include exercise to strengthen your muscles (resistance exercise), such as weight lifting, as part of your weekly exercise routine. Try to do these types of exercises for 30 minutes at least 3 days a week. Do   not use any products that contain nicotine or tobacco. These products include cigarettes, chewing tobacco, and vaping devices, such as e-cigarettes. If you need help quitting, ask your  health care provider. Control any long-term (chronic) conditions you have, such as high cholesterol or diabetes. Identify your sources of stress and find ways to manage stress. This may include meditation, deep breathing, or making time for fun activities. Alcohol use Do not drink alcohol if: Your health care provider tells you not to drink. You are pregnant, may be pregnant, or are planning to become pregnant. If you drink alcohol: Limit how much you have to: 0-1 drink a day for women. 0-2 drinks a day for men. Know how much alcohol is in your drink. In the [place] one drink equals one 12 oz bottle of beer (355 mL), one 5 oz glass of wine (148 mL), or one 1 oz glass of hard liquor (44 mL). Medicines Your health care provider may prescribe medicine if lifestyle changes are not enough to get your blood pressure under control and if: Your systolic blood pressure is 130 or higher. Your diastolic blood pressure is 80 or higher. Take medicines only as told by your health care provider. Follow the directions carefully. Blood pressure medicines must be taken as told by your health care provider. The medicine does not work as well when you skip doses. Skipping doses also puts you at risk for problems. Monitoring Before you monitor your blood pressure: Do not smoke, drink caffeinated beverages, or exercise within 30 minutes before taking a measurement. Use the bathroom and empty your bladder (urinate). Sit quietly for at least 5 minutes before taking measurements. Monitor your blood pressure at home as told by your health care provider. To do this: Sit with your back straight and supported. Place your feet flat on the floor. Do not cross your legs. Support your arm on a flat surface, such as a table. Make sure your upper arm is at heart level. Each time you measure, take two or three readings one minute apart and record the results. You may also need to have your blood pressure checked regularly by  your health care provider. General information Talk with your health care provider about your diet, exercise habits, and other lifestyle factors that may be contributing to hypertension. Review all the medicines you take with your health care provider because there may be side effects or interactions. Keep all follow-up visits. Your health care provider can help you create and adjust your plan for managing your high blood pressure. Where to find more information National Heart, Lung, and Blood Institute: www.nhlbi.nih.gov American Heart Association: www.heart.org Contact a health care provider if: You think you are having a reaction to medicines you have taken. You have repeated (recurrent) headaches. You feel dizzy. You have swelling in your ankles. You have trouble with your vision. Get help right away if: You develop a severe headache or confusion. You have unusual weakness or numbness, or you feel faint. You have severe pain in your chest or abdomen. You vomit repeatedly. You have trouble breathing. These symptoms may be an emergency. Get help right away. Call 911. Do not wait to see if the symptoms will go away. Do not drive yourself to the hospital. Summary Hypertension is when the force of blood pumping through your arteries is too strong. If this condition is not controlled, it may put you at risk for serious complications. Your personal target blood pressure may vary depending on your medical conditions,   your age, and other factors. For most people, a normal blood pressure is less than 120/80. Hypertension is managed by lifestyle changes, medicines, or both. Lifestyle changes to help manage hypertension include losing weight, eating a healthy, low-sodium diet, exercising more, stopping smoking, and limiting alcohol. This information is not intended to replace advice given to you by your health care provider. Make sure you discuss any questions you have with your health care  provider. Document Revised: 07/25/2021 Document Reviewed: 07/25/2021 Elsevier Patient Education  2024 [employer]  

## 2023-10-10 ENCOUNTER — Other Ambulatory Visit: Payer: Self-pay | Admitting: Physician Assistant

## 2023-10-10 DIAGNOSIS — I10 Essential (primary) hypertension: Secondary | ICD-10-CM

## 2024-01-13 ENCOUNTER — Other Ambulatory Visit: Payer: Self-pay | Admitting: Physician Assistant

## 2024-01-13 DIAGNOSIS — I10 Essential (primary) hypertension: Secondary | ICD-10-CM

## 2024-04-24 ENCOUNTER — Other Ambulatory Visit: Payer: Self-pay | Admitting: Physician Assistant

## 2024-04-24 DIAGNOSIS — I10 Essential (primary) hypertension: Secondary | ICD-10-CM

## 2024-07-29 ENCOUNTER — Other Ambulatory Visit: Payer: Self-pay | Admitting: Physician Assistant

## 2024-07-29 DIAGNOSIS — I10 Essential (primary) hypertension: Secondary | ICD-10-CM

## 2024-08-31 ENCOUNTER — Other Ambulatory Visit: Payer: Self-pay | Admitting: Physician Assistant

## 2024-08-31 DIAGNOSIS — I10 Essential (primary) hypertension: Secondary | ICD-10-CM

## 2024-09-05 ENCOUNTER — Ambulatory Visit: Payer: Self-pay | Admitting: Physician Assistant

## 2024-09-05 ENCOUNTER — Encounter: Payer: Self-pay | Admitting: Physician Assistant

## 2024-09-05 VITALS — BP 142/92 | HR 90 | Ht 71.0 in | Wt 234.0 lb

## 2024-09-05 DIAGNOSIS — E785 Hyperlipidemia, unspecified: Secondary | ICD-10-CM

## 2024-09-05 DIAGNOSIS — Z13220 Encounter for screening for lipoid disorders: Secondary | ICD-10-CM

## 2024-09-05 DIAGNOSIS — Z6832 Body mass index (BMI) 32.0-32.9, adult: Secondary | ICD-10-CM

## 2024-09-05 DIAGNOSIS — I10 Essential (primary) hypertension: Secondary | ICD-10-CM

## 2024-09-05 DIAGNOSIS — E6609 Other obesity due to excess calories: Secondary | ICD-10-CM

## 2024-09-05 DIAGNOSIS — E66811 Obesity, class 1: Secondary | ICD-10-CM

## 2024-09-05 MED ORDER — VALSARTAN-HYDROCHLOROTHIAZIDE 160-25 MG PO TABS: 1.0000 | ORAL_TABLET | Freq: Every day | ORAL | 0 refills | Status: DC

## 2024-09-05 NOTE — Patient Instructions (Signed)
 Stop losartan  Start Valsartan/hydrochlorothiazide daily but move to in the morning taking  Managing Your Hypertension Hypertension, also called high blood pressure, is when the force of the blood pressing against the walls of the arteries is too strong. Arteries are blood vessels that carry blood from your heart throughout your body. Hypertension forces the heart to work harder to pump blood and may cause the arteries to become narrow or stiff. Understanding blood pressure readings A blood pressure reading includes a higher number over a lower number: The first, or top, number is called the systolic pressure. It is a measure of the pressure in your arteries as your heart beats. The second, or bottom number, is called the diastolic pressure. It is a measure of the pressure in your arteries as the heart relaxes. For most people, a normal blood pressure is below 120/80. Your personal target blood pressure may vary depending on your medical conditions, your age, and other factors. Blood pressure is classified into four stages. Based on your blood pressure reading, your health care provider may use the following stages to determine what type of treatment you need, if any. Systolic pressure and diastolic pressure are measured in a unit called millimeters of mercury (mmHg). Normal Systolic pressure: below 120. Diastolic pressure: below 80. Elevated Systolic pressure: 120-129. Diastolic pressure: below 80. Hypertension stage 1 Systolic pressure: 130-139. Diastolic pressure: 80-89. Hypertension stage 2 Systolic pressure: 140 or above. Diastolic pressure: 90 or above. How can this condition affect me? Managing your hypertension is very important. Over time, hypertension can damage the arteries and decrease blood flow to parts of the body, including the brain, heart, and kidneys. Having untreated or uncontrolled hypertension can lead to: A heart attack. A stroke. A weakened blood vessel  (aneurysm). Heart failure. Kidney damage. Eye damage. Memory and concentration problems. Vascular dementia. What actions can I take to manage this condition? Hypertension can be managed by making lifestyle changes and possibly by taking medicines. Your health care provider will help you make a plan to bring your blood pressure within a normal range. You may be referred for counseling on a healthy diet and physical activity. Nutrition  Eat a diet that is high in fiber and potassium, and low in salt (sodium), added sugar, and fat. An example eating plan is called the DASH diet. DASH stands for Dietary Approaches to Stop Hypertension. To eat this way: Eat plenty of fresh fruits and vegetables. Try to fill one-half of your plate at each meal with fruits and vegetables. Eat whole grains, such as whole-wheat pasta, brown rice, or whole-grain bread. Fill about one-fourth of your plate with whole grains. Eat low-fat dairy products. Avoid fatty cuts of meat, processed or cured meats, and poultry with skin. Fill about one-fourth of your plate with lean proteins such as fish, chicken without skin, beans, eggs, and tofu. Avoid pre-made and processed foods. These tend to be higher in sodium, added sugar, and fat. Reduce your daily sodium intake. Many people with hypertension should eat less than 1,500 mg of sodium a day. Lifestyle  Work with your health care provider to maintain a healthy body weight or to lose weight. Ask what an ideal weight is for you. Get at least 30 minutes of exercise that causes your heart to beat faster (aerobic exercise) most days of the week. Activities may include walking, swimming, or biking. Include exercise to strengthen your muscles (resistance exercise), such as weight lifting, as part of your weekly exercise routine. Try to do these  types of exercises for 30 minutes at least 3 days a week. Do not use any products that contain nicotine or tobacco. These products include  cigarettes, chewing tobacco, and vaping devices, such as e-cigarettes. If you need help quitting, ask your health care provider. Control any long-term (chronic) conditions you have, such as high cholesterol or diabetes. Identify your sources of stress and find ways to manage stress. This may include meditation, deep breathing, or making time for fun activities. Alcohol use Do not drink alcohol if: Your health care provider tells you not to drink. You are pregnant, may be pregnant, or are planning to become pregnant. If you drink alcohol: Limit how much you have to: 0-1 drink a day for women. 0-2 drinks a day for men. Know how much alcohol is in your drink. In the [place] one drink equals one 12 oz bottle of beer (355 mL), one 5 oz glass of wine (148 mL), or one 1 oz glass of hard liquor (44 mL). Medicines Your health care provider may prescribe medicine if lifestyle changes are not enough to get your blood pressure under control and if: Your systolic blood pressure is 130 or higher. Your diastolic blood pressure is 80 or higher. Take medicines only as told by your health care provider. Follow the directions carefully. Blood pressure medicines must be taken as told by your health care provider. The medicine does not work as well when you skip doses. Skipping doses also puts you at risk for problems. Monitoring Before you monitor your blood pressure: Do not smoke, drink caffeinated beverages, or exercise within 30 minutes before taking a measurement. Use the bathroom and empty your bladder (urinate). Sit quietly for at least 5 minutes before taking measurements. Monitor your blood pressure at home as told by your health care provider. To do this: Sit with your back straight and supported. Place your feet flat on the floor. Do not cross your legs. Support your arm on a flat surface, such as a table. Make sure your upper arm is at heart level. Each time you measure, take two or three readings  one minute apart and record the results. You may also need to have your blood pressure checked regularly by your health care provider. General information Talk with your health care provider about your diet, exercise habits, and other lifestyle factors that may be contributing to hypertension. Review all the medicines you take with your health care provider because there may be side effects or interactions. Keep all follow-up visits. Your health care provider can help you create and adjust your plan for managing your high blood pressure. Where to find more information National Heart, Lung, and Blood Institute: [employer].is American Heart Association: www.heart.org Contact a health care provider if: You think you are having a reaction to medicines you have taken. You have repeated (recurrent) headaches. You feel dizzy. You have swelling in your ankles. You have trouble with your vision. Get help right away if: You develop a severe headache or confusion. You have unusual weakness or numbness, or you feel faint. You have severe pain in your chest or abdomen. You vomit repeatedly. You have trouble breathing. These symptoms may be an emergency. Get help right away. Call 911. Do not wait to see if the symptoms will go away. Do not drive yourself to the hospital. Summary Hypertension is when the force of blood pumping through your arteries is too strong. If this condition is not controlled, it may put you at risk for serious  complications. Your personal target blood pressure may vary depending on your medical conditions, your age, and other factors. For most people, a normal blood pressure is less than 120/80. Hypertension is managed by lifestyle changes, medicines, or both. Lifestyle changes to help manage hypertension include losing weight, eating a healthy, low-sodium diet, exercising more, stopping smoking, and limiting alcohol. This information is not intended to replace advice given  to you by your health care provider. Make sure you discuss any questions you have with your health care provider. Document Revised: 07/25/2021 Document Reviewed: 07/25/2021 Elsevier Patient Education  2024 [employer].

## 2024-09-05 NOTE — Progress Notes (Signed)
 "  Established Patient Office Visit  Subjective   Patient ID: Jermon Chalfant, male    DOB: 06-06-1981  Age: 43 y.o. MRN: [id number]  Chief Complaint  Patient presents with   Medical Management of Chronic Issues    HPI Discussed the use of AI scribe software for clinical note transcription with the patient, who gave verbal consent to proceed.  History of Present Illness Madhav Mohon is a 43 year old male with hypertension who presents for medication refills.  Hypertension - Takes losartan  for blood pressure control, dosing at night - Has not been seen in over a year for follow-up - Compliant with medication regimen - Does not consistently monitor blood pressure at home; wife assists with measurements - Blood pressure readings are initially elevated but decrease after relaxation - History of white coat hypertension - Blood pressure remained elevated after losartan  dose increase, but perceived improvement in stress levels - Concerned about potential impact of hypertension on renal function - No chest pain  Tinnitus - Chronic ear ringing for years - Associates tinnitus with elevated blood pressure  Lifestyle and dietary habits - No recent weight loss or dietary improvements - Diet includes processed foods and irregular eating patterns, often resulting in poor food choices when hungry - Previously lost 20 pounds and felt better with regular Pel[employer] exercise, but no longer maintains this routine  Substance use - History of heavy marijuana use, abstinent for almost two years - Minimal alcohol consumption, with a couple of beers every two weeks     ROS See HPI.    Objective:     BP (!) 142/92   Pulse 90   Ht 5' 11 (1.803 m)   Wt 234 lb (106.1 kg)   SpO2 99%   BMI 32.64 kg/m  BP Readings from Last 3 Encounters:  09/05/24 (!) 142/92  07/14/23 (!) 150/90  04/23/22 139/87   Wt Readings from Last 3 Encounters:  09/05/24 234 lb (106.1 kg)  07/14/23 223 lb (101.2 kg)   04/23/22 227 lb (103 kg)      Physical Exam Constitutional:      Appearance: Normal appearance. He is obese.  HENT:     Head: Normocephalic.  Neck:     Vascular: No carotid bruit.  Cardiovascular:     Rate and Rhythm: Normal rate and regular rhythm.  Pulmonary:     Effort: Pulmonary effort is normal.     Breath sounds: Normal breath sounds.  Musculoskeletal:     Right lower leg: No edema.     Left lower leg: No edema.  Neurological:     General: No focal deficit present.     Mental Status: He is alert and oriented to person, place, and time.  Psychiatric:        Mood and Affect: Mood normal.       Assessment & Plan:  SABRASABRABaldemar was seen today for medical management of chronic issues.  Diagnoses and all orders for this visit:  Uncontrolled hypertension -     CBC with Differential/Platelet -     CMP14+EGFR -     Lipid panel -     PSA, total and free -     TSH -     VITAMIN D  25 Hydroxy (Vit-D Deficiency, Fractures) -     valsartan -hydrochlorothiazide  (DIOVAN -HCT) 160-25 MG tablet; Take 1 tablet by mouth daily.  White coat syndrome with diagnosis of hypertension  Class 1 obesity due to excess calories with serious comorbidity and body mass index (  BMI) of 32.0 to 32.9 in adult -     Lipid panel  Hyperlipidemia LDL goal <130 -     Lipid panel  Screening for lipid disorders -     CMP14+EGFR   Assessment & Plan Essential hypertension with white coat effect Hypertension uncontrolled on losartan . Reports elevated home blood pressure, decreases with relaxation. Possible ear ringing from hypertension or medication. Current losartan  dose low, plan to switch to more potent medication. BP improved some with 2nd recheck. - Start valsartan , hydrochlorothiazide  160/25 mg daily in the morning. - Discuss potential increased urination due to diuretic. - Provide low sodium hypertensive diet handout. - Schedule follow-up in two weeks with home blood pressure readings and  office visit. - lipid/cmp ordered today for screening and risk assessment  Obesity, class 1 No significant lifestyle changes since last visit. Reports poor dietary choices and inconsistent exercise. Married to health-conscious partner, potential support for lifestyle changes. - Encourage healthy eating habits and regular exercise. - Advise keeping healthy snacks available.      Return in about 2 weeks (around 09/19/2024) for 2 week nurse visit to recheck BP and compare to home readings.    Camilla Skeen, PA-C  "

## 2024-09-06 ENCOUNTER — Ambulatory Visit: Payer: Self-pay | Admitting: Physician Assistant

## 2024-09-06 ENCOUNTER — Encounter: Payer: Self-pay | Admitting: Physician Assistant

## 2024-09-06 LAB — CBC WITH DIFFERENTIAL/PLATELET
Basophils Absolute: 0.1 x10E3/uL (ref 0.0–0.2)
Basos: 1 %
EOS (ABSOLUTE): 0.1 x10E3/uL (ref 0.0–0.4)
Eos: 1 %
Hematocrit: 45.6 % (ref 37.5–51.0)
Hemoglobin: 15.2 g/dL (ref 13.0–17.7)
Immature Grans (Abs): 0 x10E3/uL (ref 0.0–0.1)
Immature Granulocytes: 0 %
Lymphocytes Absolute: 3.5 x10E3/uL — ABNORMAL HIGH (ref 0.7–3.1)
Lymphs: 43 %
MCH: 30.3 pg (ref 26.6–33.0)
MCHC: 33.3 g/dL (ref 31.5–35.7)
MCV: 91 fL (ref 79–97)
Monocytes Absolute: 0.5 x10E3/uL (ref 0.1–0.9)
Monocytes: 6 %
Neutrophils Absolute: 4 x10E3/uL (ref 1.4–7.0)
Neutrophils: 49 %
Platelets: 196 x10E3/uL (ref 150–450)
RBC: 5.01 x10E6/uL (ref 4.14–5.80)
RDW: 13 % (ref 11.6–15.4)
WBC: 8.2 x10E3/uL (ref 3.4–10.8)

## 2024-09-06 LAB — PSA, TOTAL AND FREE
PSA, Free Pct: 40 %
PSA, Free: 0.16 ng/mL
Prostate Specific Ag, Serum: 0.4 ng/mL (ref 0.0–4.0)

## 2024-09-06 LAB — CMP14+EGFR
ALT: 31 IU/L (ref 0–44)
AST: 24 IU/L (ref 0–40)
Albumin: 4.9 g/dL (ref 4.1–5.1)
Alkaline Phosphatase: 62 IU/L (ref 47–123)
BUN/Creatinine Ratio: 16 (ref 9–20)
BUN: 14 mg/dL (ref 6–24)
Bilirubin Total: 1 mg/dL (ref 0.0–1.2)
CO2: 23 mmol/L (ref 20–29)
Calcium: 9.8 mg/dL (ref 8.7–10.2)
Chloride: 100 mmol/L (ref 96–106)
Creatinine, Ser: 0.89 mg/dL (ref 0.76–1.27)
Globulin, Total: 2.4 g/dL (ref 1.5–4.5)
Glucose: 87 mg/dL (ref 70–99)
Potassium: 4.1 mmol/L (ref 3.5–5.2)
Sodium: 137 mmol/L (ref 134–144)
Total Protein: 7.3 g/dL (ref 6.0–8.5)
eGFR: 110 mL/min/1.73 (ref >59)

## 2024-09-06 LAB — LIPID PANEL
Chol/HDL Ratio: 5.9 ratio — ABNORMAL HIGH (ref 0.0–5.0)
Cholesterol, Total: 217 mg/dL — ABNORMAL HIGH (ref 100–199)
HDL: 37 mg/dL — ABNORMAL LOW (ref >39)
LDL Chol Calc (NIH): 154 mg/dL — ABNORMAL HIGH (ref 0–99)
Triglycerides: 142 mg/dL (ref 0–149)
VLDL Cholesterol Cal: 26 mg/dL (ref 5–40)

## 2024-09-06 LAB — VITAMIN D 25 HYDROXY (VIT D DEFICIENCY, FRACTURES): Vit D, 25-Hydroxy: 22.2 ng/mL — ABNORMAL LOW (ref 30.0–100.0)

## 2024-09-06 LAB — TSH: TSH: 1.19 u[IU]/mL (ref 0.450–4.500)

## 2024-09-06 NOTE — Progress Notes (Signed)
 Last read by Donnice Eth at 3:14PM on 09/06/2024.

## 2024-09-06 NOTE — Progress Notes (Signed)
 Anthony Huang,   Thyroid  normal.  Kidney, liver, glucose look good.  Vitamin D low. Start D3 1000 units daily.  LDL not to goal  10 year cardiovascular risk is low. For now work on diet and exercise as well as BP control recheck in 1 year.   [name].The 10-year ASCVD risk score (Arnett DK, et al., 2019) is: 3.2%   Values used to calculate the score:     Age: 43 years     Clincally relevant sex: Male     Is Non-Hispanic African American: No     Diabetic: No     Tobacco smoker: No     Systolic Blood Pressure: 142 mmHg     Is BP treated: Yes     HDL Cholesterol: 37 mg/dL     Total Cholesterol: 217 mg/dL

## 2024-09-19 ENCOUNTER — Ambulatory Visit: Payer: Self-pay

## 2024-09-23 ENCOUNTER — Ambulatory Visit: Payer: Self-pay

## 2024-09-28 ENCOUNTER — Other Ambulatory Visit: Payer: Self-pay | Admitting: Physician Assistant

## 2024-09-28 DIAGNOSIS — I10 Essential (primary) hypertension: Secondary | ICD-10-CM
